# Patient Record
Sex: Female | Born: 1965 | Race: Black or African American | Hispanic: No | Marital: Married | Smoking: Never smoker
Health system: Southern US, Community
[De-identification: ages and names within clinical notes are randomized; demographics above are authoritative.]

## PROBLEM LIST (undated history)

## (undated) DIAGNOSIS — K219 Gastro-esophageal reflux disease without esophagitis: Secondary | ICD-10-CM

## (undated) DIAGNOSIS — N390 Urinary tract infection, site not specified: Secondary | ICD-10-CM

## (undated) DIAGNOSIS — T7840XA Allergy, unspecified, initial encounter: Secondary | ICD-10-CM

## (undated) DIAGNOSIS — D126 Benign neoplasm of colon, unspecified: Secondary | ICD-10-CM

## (undated) DIAGNOSIS — D259 Leiomyoma of uterus, unspecified: Secondary | ICD-10-CM

## (undated) HISTORY — DX: Gastro-esophageal reflux disease without esophagitis: K21.9

## (undated) HISTORY — DX: Leiomyoma of uterus, unspecified: D25.9

## (undated) HISTORY — DX: Allergy, unspecified, initial encounter: T78.40XA

## (undated) HISTORY — DX: Urinary tract infection, site not specified: N39.0

## (undated) HISTORY — DX: Benign neoplasm of colon, unspecified: D12.6

---

## 1998-12-29 ENCOUNTER — Other Ambulatory Visit: Admission: RE | Admit: 1998-12-29 | Discharge: 1998-12-29 | Payer: Self-pay | Admitting: Obstetrics & Gynecology

## 1999-10-14 ENCOUNTER — Other Ambulatory Visit: Admission: RE | Admit: 1999-10-14 | Discharge: 1999-10-14 | Payer: Self-pay | Admitting: Obstetrics & Gynecology

## 2000-10-14 ENCOUNTER — Other Ambulatory Visit: Admission: RE | Admit: 2000-10-14 | Discharge: 2000-10-14 | Payer: Self-pay | Admitting: Obstetrics & Gynecology

## 2001-10-18 ENCOUNTER — Other Ambulatory Visit: Admission: RE | Admit: 2001-10-18 | Discharge: 2001-10-18 | Payer: Self-pay | Admitting: Obstetrics & Gynecology

## 2002-05-22 ENCOUNTER — Encounter: Admission: RE | Admit: 2002-05-22 | Discharge: 2002-05-22 | Payer: Self-pay

## 2002-05-22 LAB — HM MAMMOGRAPHY: HM Mammogram: NEGATIVE

## 2002-10-22 ENCOUNTER — Other Ambulatory Visit: Admission: RE | Admit: 2002-10-22 | Discharge: 2002-10-22 | Payer: Self-pay | Admitting: Obstetrics & Gynecology

## 2003-10-25 ENCOUNTER — Other Ambulatory Visit: Admission: RE | Admit: 2003-10-25 | Discharge: 2003-10-25 | Payer: Self-pay | Admitting: Obstetrics & Gynecology

## 2005-03-14 ENCOUNTER — Inpatient Hospital Stay (HOSPITAL_COMMUNITY): Admission: AD | Admit: 2005-03-14 | Discharge: 2005-03-14 | Payer: Self-pay | Admitting: Obstetrics & Gynecology

## 2005-03-14 ENCOUNTER — Inpatient Hospital Stay (HOSPITAL_COMMUNITY): Admission: AD | Admit: 2005-03-14 | Discharge: 2005-03-16 | Payer: Self-pay | Admitting: Obstetrics and Gynecology

## 2005-04-20 ENCOUNTER — Other Ambulatory Visit: Admission: RE | Admit: 2005-04-20 | Discharge: 2005-04-20 | Payer: Self-pay | Admitting: Obstetrics and Gynecology

## 2008-10-15 ENCOUNTER — Ambulatory Visit: Payer: Self-pay | Admitting: Family Medicine

## 2008-11-27 ENCOUNTER — Ambulatory Visit: Payer: Self-pay | Admitting: Family Medicine

## 2009-01-07 DIAGNOSIS — D126 Benign neoplasm of colon, unspecified: Secondary | ICD-10-CM

## 2009-01-07 HISTORY — DX: Benign neoplasm of colon, unspecified: D12.6

## 2009-01-10 HISTORY — PX: COLONOSCOPY: SHX174

## 2009-01-10 LAB — HM COLONOSCOPY

## 2010-10-23 ENCOUNTER — Ambulatory Visit (INDEPENDENT_AMBULATORY_CARE_PROVIDER_SITE_OTHER): Payer: BC Managed Care – PPO | Admitting: Family Medicine

## 2010-10-23 DIAGNOSIS — J309 Allergic rhinitis, unspecified: Secondary | ICD-10-CM

## 2010-12-25 NOTE — Discharge Summary (Signed)
NAMEHILARI, Amy Hill NO.:  [id number]   MEDICAL RECORD NO.:  [id number]          PATIENT TYPE:  INP   LOCATION:  9125                          FACILITY:  [facility]   PHYSICIAN:  Malva Limes, M.D.    DATE OF BIRTH:  09-25-1965   DATE OF ADMISSION:  03/14/2005  DATE OF DISCHARGE:  03/16/2005                                 DISCHARGE SUMMARY   FINAL DIAGNOSES:  Intrauterine pregnancy at [redacted] weeks gestation, active labor  with breech presentation.   PROCEDURE:  Primary low flap transverse cesarean section.   SURGEON:  Dr. Miguel Aschoff.   COMPLICATIONS:  None.   This 45 year old G1 P0 presents at 38-2/[redacted] weeks gestation to the [facility] with spontaneous rupture of membranes in active labor. Upon  admission, the patient was found to have a breech presentation and she had  been scheduled for a cesarean section on March 19, 2005 because of this  presentation. She is now being taken to the operating room.  The patient's  antepartum course up to this point has been complicated by advanced maternal  age. She did have an amniocentesis with a 46 X keloid type.  The patient's  antepartum course otherwise has been uncomplicated. She is taken to the  operating room by Dr. Miguel Aschoff on March 14, 2005 where a primary low flap  transverse cesarean section is performed with the delivery of a 6 pounds 5  ounces female infant with Apgars of 9 and 9.  The delivery went without  complications. The patient's postoperative course was benign without  significant fevers. She was felt ready to go home by postoperative day #2.  She was given a rubella vaccine for a nonimmune status prior to discharge.  She was sent home on a regular diet, told to decrease activities, told to  continue her prenatal vitamins and her iron supplement, was given Percocet  one to two every four hours as needed for pain, told she could use ibuprofen  up to 600 mg every six hours as needed for pain, was to  follow up in the  office in four weeks.   LABORATORIES ON DISCHARGE:  The patient had a hemoglobin of 9.2, white blood  cell count of 16.0.      Leilani Able, P.A.-C.    ______________________________  Malva Limes, M.D.    MB/MEDQ  D:  04/15/2005  T:  04/15/2005  Job:  [id number]

## 2010-12-25 NOTE — Op Note (Signed)
Amy Hill, Amy Hill NO.:  [id number]   MEDICAL RECORD NO.:  [id number]          PATIENT TYPE:  INP   LOCATION:  9125                          FACILITY:  [facility]   PHYSICIAN:  Miguel Aschoff, M.D.       DATE OF BIRTH:  12-Sep-1965   DATE OF PROCEDURE:  03/14/2005  DATE OF DISCHARGE:                                 OPERATIVE REPORT   PREOPERATIVE DIAGNOSIS:  Intrauterine pregnancy at 38 weeks in active labor,  with breech presentation,/   POSTOPERATIVE DIAGNOSIS:  Intrauterine pregnancy at 38 weeks in active  labor, with breech presentation, with  delivery of viable female infant  Apgar 9/9, weighing 6 pounds 5 ounces.   PROCEDURE:  Primary low flap transverse cesarean.   SECTION:  Surgeon, Miguel Aschoff, M.D.   ANESTHESIA:  Spinal.   COMPLICATIONS:  None.   JUSTIFICATION:  The patient is a 45 year old black female, gravida 1, para  0, at 38-2/7 weeks, who presented to the triage at [facility] with  spontaneous rupture of membranes, active labor, and on examination found to  have a breech presentation.  The patient had been scheduled for primary  cesarean section on August 11 and is now being taken the operating room this  procedures since she is now in active labor.  The risks and benefits of the  procedure were discussed with the patient.   PROCEDURE:  The patient was taken to the operating room and placed in a  sitting position, and spinal anesthesia was administered without difficulty.  She was then placed in the supine position deviated to the left and prepped  and draped in the usual sterile fashion.  A Pfannenstiel incision was made,  extended through the subcutaneous tissue with bleeding points being clamped  and coagulated as they were encountered.  The fascia was then identified,  incised transversely.  It was then separated from the underlying rectus  muscles.  Rectus muscles were divided in the midline.  The peritoneum was  then found and entered,  carefully avoiding underlying structures.  At this  point a bladder flap was created and protected with a bladder blade.  Then  an elliptical transverse incision was made into the lower uterine segment.  The amniotic cavity was entered.  Light meconium-stained fluid was noted.  The patient was then delivered of a viable female infant, Apgar 9 at one  minute and 9 at five minutes for a left sacrum anterior position.  The baby was in frank breech position.  The nose and mouth were suctioned  with the DeLee suction and the baby was handed to the pediatric team in  attendance.  The baby weighed 6 pounds 5 ounces.  Cord bloods were obtained for appropriate studies.  The placenta was then  delivered and at this point the uterus was evacuated and any remaining  products of conception.  The angles of the uterine incision was then ligated  using figure-of-eight sutures of #1 Vicryl.  Then the uterus was closed in  layers.  The first layer was a running interlocking suture of #1 Vicryl  followed  by an imbricating suture of #1 Vicryl.  The bladder flap was reapproximated using running continuous 2-0 Vicryl  suture.  At this point lap counts and instrument counts were taken and found  to be correct and the abdomen was closed.  The parietal peritoneum was  closed using running continuous 0 Vicryl suture.  Rectus muscles were  reapproximated using running continuous 0 Vicryl suture.  The fascia was  closed using two sutures of 0 Vicryl, each starting at the lateral fascial  angles and meeting in the midline.  The subcutaneous tissue was closed using  interrupted 2-0 Vicryl suture, and the skin  incision was closed using staples.  The estimated blood loss was  approximately 600 mL.  The patient tolerated the procedure well and went to  the recovery room in satisfactory condition.  The baby was taken to the  nursery in satisfactory condition.      Miguel Aschoff, M.D.  Electronically Signed      AR/MEDQ  D:  03/14/2005  T:  03/15/2005  Job:  [id number]

## 2011-01-14 ENCOUNTER — Encounter: Payer: Self-pay | Admitting: *Deleted

## 2011-01-21 ENCOUNTER — Ambulatory Visit (INDEPENDENT_AMBULATORY_CARE_PROVIDER_SITE_OTHER): Payer: BC Managed Care – PPO | Admitting: Family Medicine

## 2011-01-21 ENCOUNTER — Encounter: Payer: Self-pay | Admitting: Family Medicine

## 2011-01-21 ENCOUNTER — Other Ambulatory Visit: Payer: Self-pay | Admitting: Family Medicine

## 2011-01-21 VITALS — BP 120/74 | HR 80 | Ht 59.0 in | Wt 151.0 lb

## 2011-01-21 DIAGNOSIS — Z23 Encounter for immunization: Secondary | ICD-10-CM

## 2011-01-21 DIAGNOSIS — R5383 Other fatigue: Secondary | ICD-10-CM

## 2011-01-21 DIAGNOSIS — Z0000 Encounter for general adult medical examination without abnormal findings: Secondary | ICD-10-CM

## 2011-01-21 DIAGNOSIS — R5381 Other malaise: Secondary | ICD-10-CM

## 2011-01-21 DIAGNOSIS — E7800 Pure hypercholesterolemia, unspecified: Secondary | ICD-10-CM

## 2011-01-21 LAB — COMPREHENSIVE METABOLIC PANEL
ALT: 13 U/L (ref 0–35)
AST: 18 U/L (ref 0–37)
Albumin: 3.6 g/dL (ref 3.5–5.2)
Alkaline Phosphatase: 55 U/L (ref 39–117)
BUN: 10 mg/dL (ref 6–23)
CO2: 26 mEq/L (ref 19–32)
Calcium: 9 mg/dL (ref 8.4–10.5)
Chloride: 101 mEq/L (ref 96–112)
Creat: 0.69 mg/dL (ref 0.50–1.10)
Glucose, Bld: 78 mg/dL (ref 70–99)
Potassium: 4.2 mEq/L (ref 3.5–5.3)
Sodium: 134 mEq/L — ABNORMAL LOW (ref 135–145)
Total Bilirubin: 0.5 mg/dL (ref 0.3–1.2)
Total Protein: 8.5 g/dL — ABNORMAL HIGH (ref 6.0–8.3)

## 2011-01-21 LAB — POCT URINALYSIS DIPSTICK
Bilirubin, UA: NEGATIVE
Blood, UA: NEGATIVE
Glucose, UA: NEGATIVE
Ketones, UA: NEGATIVE
Leukocytes, UA: NEGATIVE
Nitrite, UA: NEGATIVE
Protein, UA: NEGATIVE
Spec Grav, UA: 1.015
Urobilinogen, UA: NEGATIVE
pH, UA: 5

## 2011-01-21 LAB — LIPID PANEL
Cholesterol: 206 mg/dL — ABNORMAL HIGH (ref 0–200)
HDL: 82 mg/dL (ref >39)
LDL Cholesterol: 108 mg/dL — ABNORMAL HIGH (ref 0–99)
Total CHOL/HDL Ratio: 2.5 Ratio
Triglycerides: 78 mg/dL (ref <150)
VLDL: 16 mg/dL (ref 0–40)

## 2011-01-21 LAB — CBC WITH DIFFERENTIAL/PLATELET
Eosinophils Relative: 1 % (ref 0–5)
HCT: 33.3 % — ABNORMAL LOW (ref 36.0–46.0)
Hemoglobin: 10.7 g/dL — ABNORMAL LOW (ref 12.0–15.0)
Lymphocytes Relative: 34 % (ref 12–46)
Lymphs Abs: 2.2 10*3/uL (ref 0.7–4.0)
MCV: 82.2 fL (ref 78.0–100.0)
Monocytes Absolute: 0.6 10*3/uL (ref 0.1–1.0)
RBC: 4.05 MIL/uL (ref 3.87–5.11)
WBC: 6.4 10*3/uL (ref 4.0–10.5)

## 2011-01-21 NOTE — Progress Notes (Signed)
Subjective:    Patient ID: Amy Hill, female    DOB: 06/13/1966, 45 y.o.   MRN: [id number]  HPI Amy Hill is a 45 y.o. female who presents for a complete physical.  She has the following concerns: None.  She has a strong family history of blood pressure and cholesterol problems, so wanted a physical to have these things checked  Immunization History  Administered Date(s) Administered  . Td 04/09/2002   Last Pap smear: 07/2010 Last mammogram: 08/2010 Last colonoscopy: 01/2009 Last DEXA: n/a Ophtho: over 5 years ago Dentist: over 5 years ago Exercise: irregularly  Past Medical History  Diagnosis Date  . Contraceptive management   . Allergy   . Migraine   . Frequent UTI   . Adenomatous colon polyp 01/2009    Past Surgical History  Procedure Date  . Cesarean section   . Colonoscopy 01/10/2009    tubular adenoma (Dr. Loreta Ave); repeat every 5 years    History   Social History  . Marital Status: Married    Spouse Name: N/A    Number of Children: 1  . Years of Education: N/A   Occupational History  . unemployed (previously worked doing Clinical biochemist)    Social History Main Topics  . Smoking status: Never Smoker   . Smokeless tobacco: Never Used  . Alcohol Use: Yes     1-2 drinks once a month  . Drug Use: No  . Sexually Active: Yes    Birth Control/ Protection: Pill   Other Topics Concern  . Not on file   Social History Narrative   1 daughter and 3 stepsons (1 of the three lives with her)    Family History  Problem Relation Age of Onset  . Hyperlipidemia Mother   . Hypertension Mother   . Colon polyps Father   . Hyperlipidemia Father   . Hypertension Father   . Gout Father   . Heart disease Maternal Grandfather   . Cancer Neg Hx     Current outpatient prescriptions:Azelastine HCl (ASTEPRO NA), Place 2 sprays into the nose daily.  , Disp: , Rfl: ;  TRI-SPRINTEC 0.18/0.215/0.25 MG-35 MCG TABS, , Disp: , Rfl: ;  Triamcinolone Acetonide (NASACORT  AQ NA), Place 2 sprays into the nose daily.  , Disp: , Rfl: ;  DISCONTD: norethindrone-ethinyl estradiol (TRIPHASIL) 0.5/0.75/1-35 MG-MCG per tablet, Take 1 tablet by mouth daily.  , Disp: , Rfl:  DISCONTD: cetirizine (ZYRTEC) 10 MG tablet, Take 10 mg by mouth daily.  , Disp: , Rfl:   No Known Allergies  Review of Systems The patient denies anorexia, fever, weight changes, headaches,  vision changes, decreased hearing, ear pain, sore throat, breast concerns, chest pain, palpitations, dizziness, syncope, dyspnea on exertion, cough, swelling, nausea, vomiting, diarrhea, constipation, abdominal pain, melena, hematochezia, indigestion/heartburn, hematuria, incontinence, dysuria, irregular menstrual cycles, vaginal discharge, odor or itch, genital lesions, joint pains, numbness, tingling, weakness, tremor, suspicious skin lesions, depression, anxiety, abnormal bleeding/bruising, or enlarged lymph nodes.    Objective:   Physical Exam BP 120/74  Pulse 80  Ht 4\' 11"  (1.499 m)  Wt 151 lb (68.493 kg)  BMI 30.50 kg/m2  LMP 01/14/2011  General Appearance:    Alert, cooperative, no distress, appears stated age  Head:    Normocephalic, without obvious abnormality, atraumatic  Eyes:    PERRL, conjunctiva/corneas clear, EOM's intact, fundi    benign  Ears:    Normal TM's and external ear canals  Nose:   Nares normal, mucosa normal,  no drainage or sinus   tenderness  Throat:   Lips, mucosa, and tongue normal; teeth and gums normal  Neck:   Supple, no lymphadenopathy;  thyroid:  no   enlargement/tenderness/nodules; no carotid   bruit or JVD  Back:    Spine nontender, no curvature, ROM normal, no CVA     tenderness  Lungs:     Clear to auscultation bilaterally without wheezes, rales or     ronchi; respirations unlabored  Chest Wall:    No tenderness or deformity   Heart:    Regular rate and rhythm, S1 and S2 normal, no murmur, rub   or gallop  Breast Exam:    Deferred to GYN  Abdomen:     Soft,  non-tender, nondistended, normoactive bowel sounds,    no masses, no hepatosplenomegaly  Genitalia:    Deferred to GYN     Extremities:   No clubbing, cyanosis or edema  Pulses:   2+ and symmetric all extremities  Skin:   Skin color, texture, turgor normal, no rashes or lesions  Lymph nodes:   Cervical, supraclavicular, and axillary nodes normal  Neurologic:   CNII-XII intact, normal strength, sensation and gait; reflexes 2+ and symmetric throughout          Psych:   Normal mood, affect, hygiene and grooming.        Assessment & Plan:   1. Routine general medical examination at a health care facility  Visual acuity screening, POCT urinalysis dipstick, Comprehensive metabolic panel, CBC with Differential, TSH, Vitamin D 25 hydroxy  2. Pure hypercholesterolemia  Lipid panel   elevated total cholesterol, but excellent HDL  3. Fatigue  Comprehensive metabolic panel, CBC with Differential, TSH, Vitamin D 25 hydroxy  4. Need for Tdap vaccination  Tdap vaccine greater than or equal to 7yo IM   Discussed monthly self breast exams and yearly mammograms after the age of 84; at least 30 minutes of aerobic activity at least 5 days/week; proper sunscreen use reviewed; healthy diet, including goals of calcium and vitamin D intake and alcohol recommendations (less than or equal to 1 drink/day) reviewed; regular seatbelt use; changing batteries in smoke detectors.  Immunization recommendations discussed--TdaP today.  Colonoscopy recommendations reviewed--due again 2015.  Weight loss was encouraged.  Recommended cutting out regular soda and sweet tea, drinking more water.

## 2011-01-22 LAB — IRON: Iron: 81 ug/dL (ref 42–145)

## 2011-01-22 LAB — FERRITIN: Ferritin: 8 ng/mL — ABNORMAL LOW (ref 10–291)

## 2011-01-22 LAB — VITAMIN B12: Vitamin B-12: 357 pg/mL (ref 211–911)

## 2011-01-22 NOTE — Progress Notes (Signed)
Alvino Chapel has seen message and is checking in on this

## 2011-01-25 ENCOUNTER — Telehealth: Payer: Self-pay | Admitting: *Deleted

## 2011-01-25 NOTE — Telephone Encounter (Signed)
Left message on pt's vm asking her to return my call to go over lab results.

## 2011-01-25 NOTE — Telephone Encounter (Signed)
Patient returned my call. She will start OTC iron and recheck in 4-6weeks. Lab visit scheduled for 03/09/2011@8 :30am.vs

## 2011-03-08 ENCOUNTER — Other Ambulatory Visit: Payer: BC Managed Care – PPO

## 2011-03-08 DIAGNOSIS — E871 Hypo-osmolality and hyponatremia: Secondary | ICD-10-CM

## 2011-03-08 DIAGNOSIS — D649 Anemia, unspecified: Secondary | ICD-10-CM

## 2011-03-08 LAB — CBC WITH DIFFERENTIAL/PLATELET
Basophils Absolute: 0 10*3/uL (ref 0.0–0.1)
Basophils Relative: 0 % (ref 0–1)
Eosinophils Relative: 1 % (ref 0–5)
Lymphocytes Relative: 39 % (ref 12–46)
MCHC: 31.4 g/dL (ref 30.0–36.0)
MCV: 85.7 fL (ref 78.0–100.0)
Platelets: 211 10*3/uL (ref 150–400)
RDW: 16.4 % — ABNORMAL HIGH (ref 11.5–15.5)
WBC: 5.2 10*3/uL (ref 4.0–10.5)

## 2011-03-08 LAB — BASIC METABOLIC PANEL
CO2: 25 mEq/L (ref 19–32)
Chloride: 104 mEq/L (ref 96–112)
Glucose, Bld: 82 mg/dL (ref 70–99)
Potassium: 3.8 mEq/L (ref 3.5–5.3)
Sodium: 137 mEq/L (ref 135–145)

## 2011-03-09 ENCOUNTER — Other Ambulatory Visit: Payer: BC Managed Care – PPO

## 2011-03-10 ENCOUNTER — Telehealth: Payer: Self-pay | Admitting: *Deleted

## 2011-03-10 NOTE — Telephone Encounter (Signed)
Left message for patient to call me back to go over lab results

## 2011-03-11 ENCOUNTER — Telehealth: Payer: Self-pay | Admitting: *Deleted

## 2011-03-11 ENCOUNTER — Other Ambulatory Visit: Payer: Self-pay | Admitting: *Deleted

## 2011-03-11 DIAGNOSIS — D509 Iron deficiency anemia, unspecified: Secondary | ICD-10-CM

## 2011-03-11 NOTE — Telephone Encounter (Signed)
Spoke with patient, labs given. Patient scheduled for 09/13/11 @8 :30am for cbc and ferritin recheck(280.9).

## 2011-04-22 ENCOUNTER — Ambulatory Visit (INDEPENDENT_AMBULATORY_CARE_PROVIDER_SITE_OTHER): Payer: Managed Care, Other (non HMO) | Admitting: Family Medicine

## 2011-04-22 ENCOUNTER — Encounter: Payer: Self-pay | Admitting: Family Medicine

## 2011-04-22 VITALS — BP 118/68 | HR 84 | Ht 61.0 in | Wt 150.0 lb

## 2011-04-22 DIAGNOSIS — T148XXA Other injury of unspecified body region, initial encounter: Secondary | ICD-10-CM

## 2011-04-22 MED ORDER — NAPROXEN 500 MG PO TABS: 500.0000 mg | ORAL_TABLET | Freq: Two times a day (BID) | ORAL | Status: AC
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
## 2011-04-22 NOTE — Patient Instructions (Signed)
Heat, stretches, massage. Take anti-inflammatories regularly until pain is improved.  You may use Tylenol along with the Naprosyn, if needed for pain, but do not take any other OTC anti-inflammatories while taking the prescription  Muscle Strain / Pulled Muscle A pulled muscle, or muscle strain, occurs when a muscle is over-stretched. A small number of muscle fibers may also be torn. This is especially common in athletes. This happens when a sudden violent force placed on a muscle pushes it past its capacity. Usually, recovery from a pulled muscle takes 1 to 2 weeks. But complete healing will take 5 to 6 weeks. There are millions of muscle fibers. Following injury, your body will usually return to normal quickly. HOME CARE INSTRUCTIONS  While awake, apply ice to the sore muscle for 15 minutes each hour for the first 2 days. Put ice in a plastic bag and place a towel between the bag of ice and your skin.   Do not use the pulled muscle for several days. Do not use the muscle if you have pain.   You may wrap the injured area with an elastic bandage for comfort. Be careful not to bind it too tightly. This may interfere with blood circulation.   Only take over-the-counter or prescription medicines for pain, discomfort, or fever as directed by your caregiver. Do not use aspirin as this will increase bleeding (bruising) at injury site.   Warming up before exercise helps prevent muscle strains.  SEEK MEDICAL CARE IF: There is increased pain or swelling in the affected area. MAKE SURE YOU:   Understand these instructions.   Will watch your condition.   Will get help right away if you are not doing well or get worse.  Document Released: 07/26/2005 Document Re-Released: 10/20/2009 [facility] Patient Information 2011 [place], [place].

## 2011-09-13 ENCOUNTER — Other Ambulatory Visit: Payer: BC Managed Care – PPO

## 2011-09-13 DIAGNOSIS — D509 Iron deficiency anemia, unspecified: Secondary | ICD-10-CM

## 2011-09-13 LAB — CBC WITH DIFFERENTIAL/PLATELET
Basophils Relative: 0 % (ref 0–1)
HCT: 37.9 % (ref 36.0–46.0)
Hemoglobin: 12.8 g/dL (ref 12.0–15.0)
MCHC: 33.8 g/dL (ref 30.0–36.0)
Monocytes Absolute: 0.5 10*3/uL (ref 0.1–1.0)
Monocytes Relative: 11 % (ref 3–12)
Neutro Abs: 2.4 10*3/uL (ref 1.7–7.7)

## 2011-09-14 ENCOUNTER — Encounter: Payer: Self-pay | Admitting: Family Medicine

## 2011-12-27 ENCOUNTER — Encounter: Payer: Self-pay | Admitting: Family Medicine

## 2011-12-27 ENCOUNTER — Ambulatory Visit (INDEPENDENT_AMBULATORY_CARE_PROVIDER_SITE_OTHER): Payer: BC Managed Care – PPO | Admitting: Family Medicine

## 2011-12-27 ENCOUNTER — Ambulatory Visit: Payer: BC Managed Care – PPO | Admitting: Family Medicine

## 2011-12-27 VITALS — BP 128/88 | HR 86 | Ht 58.5 in | Wt 146.0 lb

## 2011-12-27 DIAGNOSIS — R0789 Other chest pain: Secondary | ICD-10-CM

## 2011-12-27 NOTE — Patient Instructions (Signed)
Please try and exercise daily. Try and leave your desk for a lunch break. Consider some stress reduction techniques, relaxation exercises, and regular exercise to help control stress  If worsening symptoms of chest tightness, please return for further evaluation. (x-ray, labs, possibly breathing treatment, etc)

## 2011-12-27 NOTE — Progress Notes (Signed)
Chief Complaint  Patient presents with  . tightness in chest    off and on tightness in chest. no other symptoms   HPI: First noticed tightness in her chest about 5 days ago while at work.  Wasn't exerting herself, was at rest, but was stressed.  New job since January, and has been trying to play "catch up", stressful.  Chest tightness comes and goes.  Doesn't notice it as much when she gets home.  Only slight over the weekend, less than while at work last week.  Denies feeling short of breath, palpitations. Not related to meals. Not related to exercise. Walks twice a week without problems.  +allergies--controlled with current regimen of nasal sprays.  Slight cough first thing in the morning, with throat clearing from postnasal drainage.  Denies heartburn or reflux.    She thinks it is due to stress.    Past Medical History  Diagnosis Date  . Contraceptive management   . Allergy   . Migraine   . Frequent UTI   . Adenomatous colon polyp 01/2009    Past Surgical History  Procedure Date  . Cesarean section   . Colonoscopy 01/10/2009    tubular adenoma (Dr. Loreta Ave); repeat every 5 years    History   Social History  . Marital Status: Married    Spouse Name: N/A    Number of Children: 1  . Years of Education: N/A   Occupational History  . unemployed (previously worked doing Clinical biochemist)    Social History Main Topics  . Smoking status: Never Smoker   . Smokeless tobacco: Never Used  . Alcohol Use: Yes     1-2 drinks once a month  . Drug Use: No  . Sexually Active: Yes    Birth Control/ Protection: Pill   Other Topics Concern  . Not on file   Social History Narrative   1 daughter and 3 stepsons (1 of the three lives with her)    Family History  Problem Relation Age of Onset  . Hyperlipidemia Mother   . Hypertension Mother   . Colon polyps Father   . Hyperlipidemia Father   . Hypertension Father   . Gout Father   . Heart disease Maternal Grandfather   .  Cancer Neg Hx     Current outpatient prescriptions:Azelastine HCl (ASTEPRO NA), Place 2 sprays into the nose daily.  , Disp: , Rfl: ;  Multiple Vitamins-Minerals (MULTI-VITAMIN GUMMIES PO), Take 1 each by mouth daily.  , Disp: , Rfl: ;  TRI-SPRINTEC 0.18/0.215/0.25 MG-35 MCG TABS, , Disp: , Rfl: ;  Triamcinolone Acetonide (NASACORT AQ NA), Place 2 sprays into the nose daily.  , Disp: , Rfl:  cyclobenzaprine (FLEXERIL) 10 MG tablet, Take 10 mg by mouth daily.  , Disp: , Rfl: ;  naproxen (NAPROSYN) 500 MG tablet, Take 1 tablet (500 mg total) by mouth 2 (two) times daily with a meal. Take as needed for pain, Disp: 30 tablet, Rfl: 0  No Known Allergies  ROS: Denies fevers, chest pain (just slight tightness), shortness of breath.  Denies nausea, vomiting, heartburn, belching, bowels are normal. +allergies.  See HPI.  Denies urinary complaints, bleeding/bruising, depression.  PHYSICAL EXAM: BP 128/88  Pulse 86  Ht 4' 10.5" (1.486 m)  Wt 146 lb (66.225 kg)  BMI 29.99 kg/m2 Well developed, pleasant female in no distress HEENT:  PERRL, EOMI, conjunctiva clear.  TM's and EAc's normal.  Nasal mucosa mildly edematous, no erythema.  Sinuses nontender.  OP clear  Neck: no lymphadenopathy or mass Heart: regular rate and rhythm without murmur Lungs: clear bilaterally. No wheeze or cough with forced expiration Chest: nontender to palpation Abdomen: nontender, no mass Extremities: no edema Psych: slightly anxious, otherwise normal  EKG: NSR rate 90.  Possible LAE, borderline LVH.  No acute abnormalities  ASSESSMENT/PLAN: 1. Chest tightness    Differential diagnoses reviewed.  Discussed pulmonary etiologies--reassured by normal exam.  Can't exclude mild RAD--no wheezing on exam.  Declined trial of albuterol to see if symptoms improved.  Consider if story sounds more like RAD, NOT consistent now. Doubt cardiac etiology--symptoms occuring at rest, not worse with exertion, no EKG changes worrisome for  ischemia.  Chest wall is nontender, so doesn't appear to be musculoskeletal.  No GI complaints and not related to meals to be consistent with reflux.  Likely is related to stress.  Discussed relaxation techniques, regular exercise  F/u if persistent or worsening symptoms.  Chart reviewed--lipids normal <1 year ago.  Has been treated for iron deficiency anemia--last check 09/2011 and much improved; advised to take iron with menstrual cycles.  25 minute visit, more than 1/2 counseling

## 2011-12-30 ENCOUNTER — Encounter: Payer: Self-pay | Admitting: Family Medicine

## 2012-05-30 ENCOUNTER — Telehealth: Payer: Self-pay | Admitting: Internal Medicine

## 2012-05-30 NOTE — Telephone Encounter (Signed)
Faxed medical records to [facility] attorney and counselors at [employer]

## 2012-10-02 ENCOUNTER — Other Ambulatory Visit: Payer: Self-pay

## 2013-06-21 ENCOUNTER — Encounter: Payer: Self-pay | Admitting: Family Medicine

## 2013-08-10 ENCOUNTER — Encounter: Payer: Self-pay | Admitting: Medical

## 2013-08-10 ENCOUNTER — Ambulatory Visit (INDEPENDENT_AMBULATORY_CARE_PROVIDER_SITE_OTHER): Payer: BC Managed Care – PPO | Admitting: Medical

## 2013-08-10 VITALS — BP 150/80 | HR 104 | Temp 98.4°F | Resp 16 | Wt 149.0 lb

## 2013-08-10 DIAGNOSIS — R030 Elevated blood-pressure reading, without diagnosis of hypertension: Secondary | ICD-10-CM

## 2013-08-10 DIAGNOSIS — R42 Dizziness and giddiness: Secondary | ICD-10-CM

## 2013-08-10 DIAGNOSIS — R5381 Other malaise: Secondary | ICD-10-CM

## 2013-08-10 DIAGNOSIS — R5383 Other fatigue: Secondary | ICD-10-CM

## 2013-08-10 DIAGNOSIS — R531 Weakness: Secondary | ICD-10-CM

## 2013-08-10 LAB — BASIC METABOLIC PANEL
BUN: 8 mg/dL (ref 6–23)
CHLORIDE: 103 meq/L (ref 96–112)
CO2: 25 meq/L (ref 19–32)
CREATININE: 0.79 mg/dL (ref 0.50–1.10)
Calcium: 8.8 mg/dL (ref 8.4–10.5)
Glucose, Bld: 91 mg/dL (ref 70–99)
Potassium: 3.6 mEq/L (ref 3.5–5.3)
Sodium: 138 mEq/L (ref 135–145)

## 2013-08-10 LAB — POCT URINALYSIS DIPSTICK
Bilirubin, UA: NEGATIVE
Glucose, UA: NEGATIVE
Ketones, UA: NEGATIVE
Leukocytes, UA: NEGATIVE
NITRITE UA: NEGATIVE
PH UA: 6
Spec Grav, UA: <=1.005
UROBILINOGEN UA: NEGATIVE

## 2013-08-10 LAB — POCT HEMOGLOBIN: Hemoglobin: 13.2 g/dL (ref 12.2–16.2)

## 2013-08-10 LAB — TSH: TSH: 1.445 u[IU]/mL (ref 0.350–4.500)

## 2013-08-10 LAB — POCT URINE PREGNANCY: Preg Test, Ur: NEGATIVE

## 2013-08-10 NOTE — Progress Notes (Signed)
Subjective:  Amy Hill is a 48 y.o. female who presents for generalized weakness.  For the past 1.5 week, she notes generalized weakness, light headedness. Yesterday was fine.  Got light headed Monday and Wednesday.  This morning felt a little lightheaded too.   Feels this mostly in the morning when she gets up.   Some symptoms during the day, but mostly morning.  Doesn't usually eat breakfast.   She notes in the past, last visit here Dr. Tomi Bamberger advised she use iron since last visit but she has not taken iron.  LMP currently.  Last sexual activity 1 wk ago.  Married.  Takes OCP, very compliant with this.  Saw urgent care /[facility] Wednesday.  BP was up a little, but they really didn't give diagnosis or treatment one way or another.  No other aggravating or relieving factors.    Denies hx/o heart disease in self or family.  No recent heavy alcohol use.  Drank a few drinks on Christmas day.   She does endorse hx/o anemia, but never serious, no hx/o transfusion.  Review of Systems Constitutional: -fever, -chills, -sweats, -unexpected weight change, +fatigue ENT: -runny nose, -ear pain, -sore throat Cardiology:  -chest pain, -palpitations, -edema  Respiratory: +mild occasdional cough, -shortness of breath, -wheezing Gastroenterology: -abdominal pain, -nausea, -vomiting, -diarrhea, -constipation  Hematology: -bleeding or bruising problems Musculoskeletal: -arthralgias, -myalgias, -joint swelling, -back pain Ophthalmology: -vision changes Urology: -dysuria, -difficulty urinating, -hematuria, -urinary frequency, -urgency Neurology: -headache, -tingling, -numbness, -speech changes, -no mental status changes    Past Medical History  Diagnosis Date  . Contraceptive management   . Allergy   . Migraine   . Frequent UTI   . Adenomatous colon polyp 01/2009    The following portions of the patient's history were reviewed and updated as  appropriate: allergies, current medications, past family history, past medical history, past social history, past surgical history and problem list.  Objective: Physical Exam  BP 150/90  Pulse 100  Temp(Src) 98.4 F (36.9 C) (Oral)  Resp 16  Wt 149 lb (67.586 kg)  LMP 08/09/2013   General appearance: alert, no distress, WD/WN, lean AA female HEENT: normocephalic, sclerae anicteric, conjunctiva pink and moist, TMs pearly, nares patent, no discharge or erythema, pharynx normal Oral cavity: MMM, no lesions Neck: supple, no lymphadenopathy, no thyromegaly, no masses Heart: tachycardic at about 100, otherwise RRR, normal S1, S2, no murmurs Lungs: CTA bilaterally, no wheezes, rhonchi, or rales Abdomen: +bs, soft, non tender, non distended, no masses, no hepatomegaly, no splenomegaly Pulses: 2+ radial pulses, 2+ pedal pulses, normal cap refill Ext: no edema Neuro: CN2-12 intact, DTRs 2+, nonfocal exam    Adult ECG Report  Indication: generalized weakness  Rate: 98bpm  Rhythm: normal sinus rhythm  QRS Axis: 69 degrees  PR Interval: 168ms  QRS Duration: 19ms  QTc: 476ms  Conduction Disturbances: none  Other Abnormalities: voltage criteria for LVH  Patient's cardiac risk factors are: none.  EKG comparison: 12/27/11 EKG, unchanged.  At that time was also J point elevation, LVH criteria  Narrative Interpretation: LVH per voltage criteria, no acute change, abnormal EKG    Assessment: Encounter Diagnoses  Name Primary?  . Generalized weakness Yes  . Lightheaded   . Elevated blood pressure reading without diagnosis of hypertension     Plan: Her orthostatic readings were fine, no drop in blood pressure, but there was a slight pulse increase from lying to standing though.  Her hemoglobin was normal on fingerstick, urine pregnancy negative, urinalysis  positive for blood but she is on her menstrual cycle now.  We will send STAT BMET and TSH.    We discussed her exam findings, which  are most notable for pale mucous membranes, a little tachycardic, elevated blood pressure, otherwise her exam is pretty normal.  We discussed her generalized symptoms of weakness which could be related to several things including viral syndrome, mild dehydration, other, etc.  I advised that we cannot rule out certain things based on what information we have now.  Looking back of her prior records she has had normal blood pressure readings prior.  After she left we were able to pull her old chart and EKG findings appear to be the same as prior, she has even seen cardiology in the past, was put on medication for a period of time although she didn't recall even been on medication for blood pressure in the past per the conversation today.  Follow up: pending labs

## 2013-08-13 ENCOUNTER — Telehealth: Payer: Self-pay | Admitting: Family Medicine

## 2013-08-13 ENCOUNTER — Ambulatory Visit: Payer: BC Managed Care – PPO

## 2013-08-13 NOTE — Telephone Encounter (Signed)
I assume she worked on improving her hydration and rest over the weekend.   Is she taking any ibuprofen or tylenol for pain?  If not, try this.  Regarding the breathing symptoms, if this continues or worsens in the next few days, may consider recheck

## 2013-08-13 NOTE — Telephone Encounter (Signed)
Patient came by here after lunch for a BP check and it was 150/78. CLS

## 2013-08-13 NOTE — Telephone Encounter (Signed)
Patient called with a up date of her Sx. She said she was at work . She said she still feels a little week  But not like Friday. She states when she is a certain way when she breaths she has pain on her right side. CLS

## 2013-08-14 NOTE — Telephone Encounter (Signed)
I left the patient a detailed message. CLS

## 2013-08-14 NOTE — Telephone Encounter (Signed)
Have her record a few more random BP readings at local drug store, and f/u with Dr. Tomi Bamberger in a week or 2.  It would appear that we may need to start her on BP medication after reviewing recent readings and prior chart records.  Dr. Tomi Bamberger is her PCM.

## 2013-08-23 ENCOUNTER — Encounter: Payer: Self-pay | Admitting: Family Medicine

## 2013-08-23 ENCOUNTER — Ambulatory Visit: Payer: BC Managed Care – PPO | Admitting: Family Medicine

## 2013-08-23 ENCOUNTER — Ambulatory Visit (INDEPENDENT_AMBULATORY_CARE_PROVIDER_SITE_OTHER): Payer: BC Managed Care – PPO | Admitting: Family Medicine

## 2013-08-23 VITALS — BP 118/74 | HR 104 | Ht 61.0 in | Wt 144.0 lb

## 2013-08-23 DIAGNOSIS — R079 Chest pain, unspecified: Secondary | ICD-10-CM

## 2013-08-23 DIAGNOSIS — R059 Cough, unspecified: Secondary | ICD-10-CM

## 2013-08-23 DIAGNOSIS — R05 Cough: Secondary | ICD-10-CM

## 2013-08-23 NOTE — Progress Notes (Signed)
Chief Complaint  Patient presents with  . Chest Pain    did have a cough after Christmas for 2 weeks or so that went away. Over the last week she has been having some intermittent dull chest pains that mover from place to place. Does not feel SOB. Saw [facility] 08/10/13 and EKG was done.    She presents today with complaint of chest pain. It comes and goes, sometimes right sided, sometimes left sided, sometimes the whole chest. It isn't sore to touch. Sometimes she feels the pain with a deep breath, but most of the time she doesn't have pain with breathing.  Pain isn't exertional, or related to eating.  Pain isn't positional.  She feels the pain more in the evenings (not lying down, just later in the evening).  Pain is described as dull ache, just lasts for a few seconds, comes and goes within the hour, and has pain throughout the day. Pain has been daily for the last 2-3 days.  She denies any shortness of breath.  Notes reviewed from 08/10/13 visit, where she had generalized weakness.  She had a cough at that time also.  She got sick the weekend after Christmas with a bad cough, but the phlegm had resolved, but still had some cough at the time of her visit.  She still has just a very mild intermittent, occasional cough.  Her BP was high at that visit, and she was told to monitor at home.  She actually has appt set up for next week with me to f/u on her blood pressures.  She brings in list of blood pressures checked  (taken with new monitor):  90-128/66-75 (one time had 128/102 ? Accuracy?).  She will bring this new monitor to next visit. She isn't having headaches, dizziness, palpitations.  Past Medical History  Diagnosis Date  . Contraceptive management   . Allergy   . Migraine   . Frequent UTI   . Adenomatous colon polyp 01/2009   Past Surgical History  Procedure Laterality Date  . Cesarean section    . Colonoscopy  01/10/2009    tubular adenoma (Dr. Collene Mares); repeat every 5 years   History   Social  History  . Marital Status: Married    Spouse Name: N/A    Number of Children: 1  . Years of Education: N/A   Occupational History  . unemployed (previously worked doing Investment banker, corporate)    Social History Main Topics  . Smoking status: Never Smoker   . Smokeless tobacco: Never Used  . Alcohol Use: Yes     Comment: 1-2 drinks once a month  . Drug Use: No  . Sexual Activity: Yes    Birth Control/ Protection: Pill   Other Topics Concern  . Not on file   Social History Narrative   1 daughter and 3 stepsons (1 of the three lives with her)    Current outpatient prescriptions:IRON PO, Take 130 mg by mouth daily., Disp: , Rfl: ;  Multiple Vitamins-Minerals (MULTI-VITAMIN GUMMIES PO), Take 1 each by mouth daily.  , Disp: , Rfl: ;  TRI-SPRINTEC 0.18/0.215/0.25 MG-35 MCG TABS, , Disp: , Rfl: ;  Azelastine HCl (ASTEPRO NA), Place 2 sprays into the nose daily.  , Disp: , Rfl: ;  cyclobenzaprine (FLEXERIL) 10 MG tablet, Take 10 mg by mouth daily.  , Disp: , Rfl:  Triamcinolone Acetonide (NASACORT AQ NA), Place 2 sprays into the nose daily.  , Disp: , Rfl:   No Known Allergies  ROS: Denies fevers, chills, nausea, vomiting, heartburn, indigestion, abdominal pain, diarrhea, swelling, bleeding, bruising, rashes.  Denies any URI symptoms currently.  PHYSICAL EXAM: BP 118/74  Pulse 104  Ht 5\' 1"  (1.549 m)  Wt 144 lb (65.318 kg)  BMI 27.22 kg/m2  LMP 08/09/2013 Well developed, pleasant female, in no distress.  She has frequent throat clearing during the visit HEENT:  PERRL, EOMI, conjunctiva clear  TM's and EAC's normal.  Nasal mucosa mildly edematous, no purulence or erythema.  Sinuses nontender.  OP clear Neck: no lymphadenopathy, thyromegaly or mass Heart: regular rate and rhythm without murmur Chest wall: nontender Abdomen: soft, nontender, no organomegaly or mass Extremities: no edema Skin: no rashes Psych: normal mood, affect, hygiene and grooming  EKG reviewed from last  visit.  ASSESSMENT/PLAN:  Chest pain  Cough  Given your frequent throat-clearing, and inflamed nasal mucus membranes, I suspect that you have some postnasal drainage.  This sometimes can cause chest congestion.  Use some claritin or zyrtec, and mucinex if needed (to thin the mucus, and help with the cough). I recommend a trial of anti-inflammatories for the chest pain, which is likely musculoskeletal.  Use Aleve twice daily for the next 5-7 days (or until pain resolved).  Or you can use ibuprofen (advil or motrin) 600-800mg  three times daily with food.  You can also try using some moist heat to the chest when it is hurting. Press around on the chest when you have the pain, to see if it tender to the touch (which is a sign of musculosketal pain).  If your chest pain and cough persists, we might need to get a chest x-ray.  Your blood pressure is much better.  F/u as scheduled for CPE, sooner if needed.

## 2013-08-23 NOTE — Patient Instructions (Signed)
  Given your frequent throat-clearing, and inflamed nasal mucus membranes, I suspect that you have some postnasal drainage.  This sometimes can cause chest congestion.  Use some claritin or zyrtec, and mucinex if needed (to thin the mucus, and help with the cough). I recommend a trial of anti-inflammatories for the chest pain, which is likely musculoskeletal.  Use Aleve twice daily for the next 5-7 days (or until pain resolved).  Or you can use ibuprofen (advil or motrin) 600-800mg  three times daily with food.  You can also try using some moist heat to the chest when it is hurting. Press around on the chest when you have the pain, to see if it tender to the touch (which is a sign of musculosketal pain).  If your chest pain and cough persists, we might need to get a chest x-ray.  Your blood pressure is much better.

## 2013-08-24 ENCOUNTER — Encounter: Payer: Self-pay | Admitting: Family Medicine

## 2013-08-30 ENCOUNTER — Encounter: Payer: Self-pay | Admitting: Family Medicine

## 2013-08-30 ENCOUNTER — Ambulatory Visit (INDEPENDENT_AMBULATORY_CARE_PROVIDER_SITE_OTHER): Payer: BC Managed Care – PPO | Admitting: Family Medicine

## 2013-08-30 VITALS — BP 110/70 | HR 100 | Ht 61.0 in | Wt 142.0 lb

## 2013-08-30 DIAGNOSIS — R079 Chest pain, unspecified: Secondary | ICD-10-CM

## 2013-08-30 DIAGNOSIS — K219 Gastro-esophageal reflux disease without esophagitis: Secondary | ICD-10-CM

## 2013-08-30 MED ORDER — DEXLANSOPRAZOLE 60 MG PO CPDR: 60.0000 mg | DELAYED_RELEASE_CAPSULE | Freq: Every day | ORAL | Status: DC

## 2013-08-30 NOTE — Progress Notes (Signed)
Chief Complaint  Patient presents with  . Follow-up    still having intermittent chest pains. Tried Dr.Sumiya Mamaril's recommendations but it still concerned.    She presents for 1 week follow-up on chest pain.  She has been paying much closer attention to her symptoms.  Chest pain occurs more after eating, more after lunch and dinner, not breakfast. Pain is usually on the right side, and sometimes is under her right breast, and sometimes is in the middle of her chest. Pain can last from a few seconds to 20-30 minutes.  She does have some belching throughout the day, but doesn't feel that this is any different than the norm for her (long before onset of chest pain).  Denies any change in diet, other than taking Emergen-C twice daily, as she feels this helps with her weakness/energy.  She tried heat, but that didn't help.  She took Aleve once daily for about 3 days, but didn't notice any improvement.  She took Zyrtec for the last week, but hasn't noticed any different.  She still finds herself clearing her throat periodically, not often, and only rare cough.  She has been taking emergen-C twice daily. This seems to help with her weakness/energy.  Past Medical History  Diagnosis Date  . Contraceptive management   . Allergy   . Migraine   . Frequent UTI   . Adenomatous colon polyp 01/2009   Past Surgical History  Procedure Laterality Date  . Cesarean section    . Colonoscopy  01/10/2009    tubular adenoma (Dr. Collene Mares); repeat every 5 years   History   Social History  . Marital Status: Married    Spouse Name: N/A    Number of Children: 1  . Years of Education: N/A   Occupational History  . unemployed (previously worked doing Investment banker, corporate)    Social History Main Topics  . Smoking status: Never Smoker   . Smokeless tobacco: Never Used  . Alcohol Use: Yes     Comment: 1-2 drinks once a month  . Drug Use: No  . Sexual Activity: Yes    Birth Control/ Protection: Pill   Other Topics  Concern  . Not on file   Social History Narrative   1 daughter and 3 stepsons (1 of the three lives with her)    Outpatient Encounter Prescriptions as of 08/30/2013  Medication Sig  . IRON PO Take 130 mg by mouth daily.  . Multiple Vitamins-Minerals (MULTI-VITAMIN GUMMIES PO) Take 1 each by mouth daily.    . TRI-SPRINTEC 0.18/0.215/0.25 MG-35 MCG TABS   . Azelastine HCl (ASTEPRO NA) Place 2 sprays into the nose daily.    . cyclobenzaprine (FLEXERIL) 10 MG tablet Take 10 mg by mouth daily.    . Triamcinolone Acetonide (NASACORT AQ NA) Place 2 sprays into the nose daily.      No Known Allergies  ROS:  Denies fevers, URI symptoms, signficant cough, shortness of breath, nausea, bowel changes, or other complaints.  See HPI  PHYSICAL EXAM: BP 110/70  Pulse 100  Ht 5\' 1"  (1.549 m)  Wt 142 lb (64.411 kg)  BMI 26.84 kg/m2  LMP 08/09/2013 Pt's machine showed 104/77 pulse 97 (accurate)  Well developed, pleasant female in no distress.  No throat-clearing or cough Chest: nontender to palpation Abdomen: soft, no epigastric tenderness. Negative Murphy.  No hepatosplenomegaly  ASSESSMENT/PLAN:  Chest pain - unchanged with NSAID and treatment of PND; now hx more c/w GERD.  Trial of Dexilant and behavioral changes  GERD (gastroesophageal reflux disease) - Plan: dexlansoprazole (DEXILANT) 60 MG capsule  Suspect reflux or other GI etiology for her chest pain (especially given lack of improvement with NSAID's and zyrtec, and now that history shows pain more related to food).  Exam and history not consistent with gall bladder issue.  Take the Dexilant once daily for the 10 days of samples.  If pain recurs after stopping, you may change over to an OTC product such as Prilosec or Nexium (coupon given for Nexium).  F/u at CPE, sooner prn

## 2013-08-30 NOTE — Patient Instructions (Addendum)
Gastroesophageal Reflux Disease, Adult Gastroesophageal reflux disease (GERD) happens when acid from your stomach flows up into the esophagus. When acid comes in contact with the esophagus, the acid causes soreness (inflammation) in the esophagus. Over time, GERD may create small holes (ulcers) in the lining of the esophagus. CAUSES   Increased body weight. This puts pressure on the stomach, making acid rise from the stomach into the esophagus.  Smoking. This increases acid production in the stomach.  Drinking alcohol. This causes decreased pressure in the lower esophageal sphincter (valve or ring of muscle between the esophagus and stomach), allowing acid from the stomach into the esophagus.  Late evening meals and a full stomach. This increases pressure and acid production in the stomach.  A malformed lower esophageal sphincter. Sometimes, no cause is found. SYMPTOMS   Burning pain in the lower part of the mid-chest behind the breastbone and in the mid-stomach area. This may occur twice a week or more often.  Trouble swallowing.  Sore throat.  Dry cough.  Asthma-like symptoms including chest tightness, shortness of breath, or wheezing. DIAGNOSIS  Your caregiver may be able to diagnose GERD based on your symptoms. In some cases, X-rays and other tests may be done to check for complications or to check the condition of your stomach and esophagus. TREATMENT  Your caregiver may recommend over-the-counter or prescription medicines to help decrease acid production. Ask your caregiver before starting or adding any new medicines.  HOME CARE INSTRUCTIONS   Change the factors that you can control. Ask your caregiver for guidance concerning weight loss, quitting smoking, and alcohol consumption.  Avoid foods and drinks that make your symptoms worse, such as:  Caffeine or alcoholic drinks.  Chocolate.  Peppermint or mint flavorings.  Garlic and onions.  Spicy foods.  Citrus fruits,  such as oranges, lemons, or limes.  Tomato-based foods such as sauce, chili, salsa, and pizza.  Fried and fatty foods.  Avoid lying down for the 3 hours prior to your bedtime or prior to taking a nap.  Eat small, frequent meals instead of large meals.  Wear loose-fitting clothing. Do not wear anything tight around your waist that causes pressure on your stomach.  Raise the head of your bed 6 to 8 inches with wood blocks to help you sleep. Extra pillows will not help.  Only take over-the-counter or prescription medicines for pain, discomfort, or fever as directed by your caregiver.  Do not take aspirin, ibuprofen, or other nonsteroidal anti-inflammatory drugs (NSAIDs). SEEK IMMEDIATE MEDICAL CARE IF:   You have pain in your arms, neck, jaw, teeth, or back.  Your pain increases or changes in intensity or duration.  You develop nausea, vomiting, or sweating (diaphoresis).  You develop shortness of breath, or you faint.  Your vomit is green, yellow, black, or looks like coffee grounds or blood.  Your stool is red, bloody, or black. These symptoms could be signs of other problems, such as heart disease, gastric bleeding, or esophageal bleeding. MAKE SURE YOU:   Understand these instructions.  Will watch your condition.  Will get help right away if you are not doing well or get worse. Document Released: 05/05/2005 Document Revised: 10/18/2011 Document Reviewed: 02/12/2011 [facility] Patient Information 2014 [place], [place].  Take the Dexilant once daily for the 10 days of samples.  If pain recurs after stopping, you may change over to an OTC product such as Prilosec or Nexium (coupon given for Nexium).

## 2013-09-26 ENCOUNTER — Ambulatory Visit (INDEPENDENT_AMBULATORY_CARE_PROVIDER_SITE_OTHER): Payer: BC Managed Care – PPO | Admitting: Family Medicine

## 2013-09-26 ENCOUNTER — Encounter: Payer: Self-pay | Admitting: Family Medicine

## 2013-09-26 VITALS — BP 120/64 | HR 92 | Ht 61.0 in | Wt 138.0 lb

## 2013-09-26 DIAGNOSIS — R079 Chest pain, unspecified: Secondary | ICD-10-CM

## 2013-09-26 DIAGNOSIS — R0600 Dyspnea, unspecified: Secondary | ICD-10-CM

## 2013-09-26 DIAGNOSIS — R0989 Other specified symptoms and signs involving the circulatory and respiratory systems: Secondary | ICD-10-CM

## 2013-09-26 DIAGNOSIS — R0609 Other forms of dyspnea: Secondary | ICD-10-CM

## 2013-09-26 LAB — CBC WITH DIFFERENTIAL/PLATELET
BASOS ABS: 0.1 10*3/uL (ref 0.0–0.1)
BASOS PCT: 1 % (ref 0–1)
Eosinophils Absolute: 0.1 10*3/uL (ref 0.0–0.7)
Eosinophils Relative: 1 % (ref 0–5)
HCT: 37.7 % (ref 36.0–46.0)
Hemoglobin: 13.2 g/dL (ref 12.0–15.0)
Lymphocytes Relative: 31 % (ref 12–46)
Lymphs Abs: 2 10*3/uL (ref 0.7–4.0)
MCH: 30.2 pg (ref 26.0–34.0)
MCHC: 35 g/dL (ref 30.0–36.0)
MCV: 86.3 fL (ref 78.0–100.0)
MONO ABS: 0.7 10*3/uL (ref 0.1–1.0)
Monocytes Relative: 11 % (ref 3–12)
NEUTROS ABS: 3.6 10*3/uL (ref 1.7–7.7)
NEUTROS PCT: 56 % (ref 43–77)
Platelets: 240 10*3/uL (ref 150–400)
RBC: 4.37 MIL/uL (ref 3.87–5.11)
RDW: 13.8 % (ref 11.5–15.5)
WBC: 6.4 10*3/uL (ref 4.0–10.5)

## 2013-09-26 NOTE — Progress Notes (Signed)
Chief Complaint  Patient presents with  . Follow-up    still having ongoing weakness, chest pain and some SOB.    Belching improved while on the Dexilant, but the pain in her chest didn't resolve. She never took Nexium or Prilosec, since no significant benefit was noted on the Dexilant.  SOB and chest pain persisted, despite taking Dexilant for 10 days, and didn't worsen upon finishing the samples.  She is noticing more shortness of breath with movement (ie with lifting, stairs).  Symptoms are intermittent.  She has stairs in her house; 50% of the time she might have some SOB with the stairs, and 50% of the time be fine.    Chest discomfort is longer associated with meals.  Denies any URI symptoms, has some mild chronic PND/throat-clearing.  Denies any nasal congestion.  Slight cough, nonproductive.  She is currently having some mild substernal burning sensation.  She doesn't feel as bad as she did back 1/2 when seen by [place].  If she doesn't take Emergen-C and drinking Naked smoothies, her energy is worse.  Chest pain, shortness of breath and fatigue/decreased energy all seems to occur together (although much better since on the supplements)  Past Medical History  Diagnosis Date  . Contraceptive management   . Allergy   . Migraine   . Frequent UTI   . Adenomatous colon polyp 01/2009   Past Surgical History  Procedure Laterality Date  . Cesarean section    . Colonoscopy  01/10/2009    tubular adenoma (Dr. Collene Mares); repeat every 5 years   History   Social History  . Marital Status: Married    Spouse Name: N/A    Number of Children: 1  . Years of Education: N/A   Occupational History  . unemployed (previously worked doing Investment banker, corporate)    Social History Main Topics  . Smoking status: Never Smoker   . Smokeless tobacco: Never Used  . Alcohol Use: Yes     Comment: 1-2 drinks once a month  . Drug Use: No  . Sexual Activity: Yes    Birth Control/ Protection: Pill   Other  Topics Concern  . Not on file   Social History Narrative   1 daughter and 3 stepsons (1 of the three lives with her)   Outpatient Encounter Prescriptions as of 09/26/2013  Medication Sig  . Ascorbic Acid (VITAMIN C PO) Take by mouth.  . Azelastine HCl (ASTEPRO NA) Place 2 sprays into the nose daily.    . IRON PO Take 130 mg by mouth daily.  . Multiple Vitamins-Minerals (MULTI-VITAMIN GUMMIES PO) Take 1 each by mouth daily.    . TRI-SPRINTEC 0.18/0.215/0.25 MG-35 MCG TABS   . Triamcinolone Acetonide (NASACORT AQ NA) Place 2 sprays into the nose daily.    . cyclobenzaprine (FLEXERIL) 10 MG tablet Take 10 mg by mouth daily.    . [DISCONTINUED] dexlansoprazole (DEXILANT) 60 MG capsule Take 1 capsule (60 mg total) by mouth daily.   No Known Allergies  ROS:  No fevers, chills, nausea, vomiting, bowel changes, bleeding, bruising, dysphagia, headaches, dizziness, edema, calf pain, urinary symptoms or other complaints, except as noted in HPI.  PHYSICAL EXAM: BP 120/64  Pulse 92  Ht 5\' 1"  (1.549 m)  Wt 138 lb (62.596 kg)  BMI 26.09 kg/m2 LMP 09/04/13 (had normal period; negative pregnancy test done at 1/2 visit) 93%02 sat with pulse of 86--took a while to work (hands were very cold), and ? Accuracy (wasn't a strong signal). HEENT:  PERRL ,EOMI, conjunctiva clear.  Occasional throat-clearing. OP clear Neck: no lymphadenopathy or mass Heart: regular rate and rhythm without murmur Lungs: clear bilaterally, good air movement. No wheezes, rales, ronchi Chest:  nontender to palpation Abdomen: soft, nontender, no organomegaly or mass.  Extremities: Negative homan.  Extremities without edema, tenderness, cords. Psych: normal mood, affect, hygiene and grooming Neuro: alert and oriented.  Cranial nerves grossly intact, normal gait, strength  ASSESSMENT/PLAN:  Dyspnea - Plan: CBC with Differential, DG Chest 2 View, D-dimer, quantitative  Chest pain - Plan: CBC with Differential, DG Chest 2 View,  D-dimer, quantitative  CXR, CBC and d-dimer.  If normal, send for PFT's.

## 2013-09-26 NOTE — Patient Instructions (Signed)
Go to [facility] tomorrow for chest x-ray.  If blood test and x-ray results are normal, we will refer you for pulmonary function testing (PFT's) to further evaluate your pain and shortness of breath.

## 2013-09-27 LAB — D-DIMER, QUANTITATIVE: D-Dimer, Quant: 0.35 ug/mL-FEU (ref 0.00–0.48)

## 2013-09-28 ENCOUNTER — Ambulatory Visit
Admission: RE | Admit: 2013-09-28 | Discharge: 2013-09-28 | Disposition: A | Discharge status: Home / Self Care | Payer: BC Managed Care – PPO | Source: Ambulatory Visit | Attending: Family Medicine | Admitting: Family Medicine

## 2013-09-28 ENCOUNTER — Other Ambulatory Visit: Payer: Self-pay | Admitting: Family Medicine

## 2013-09-28 DIAGNOSIS — R079 Chest pain, unspecified: Secondary | ICD-10-CM

## 2013-09-28 DIAGNOSIS — R0602 Shortness of breath: Secondary | ICD-10-CM

## 2013-09-28 DIAGNOSIS — R0600 Dyspnea, unspecified: Secondary | ICD-10-CM

## 2013-10-05 ENCOUNTER — Other Ambulatory Visit: Payer: Self-pay | Admitting: Family Medicine

## 2013-10-05 ENCOUNTER — Ambulatory Visit (INDEPENDENT_AMBULATORY_CARE_PROVIDER_SITE_OTHER): Payer: BC Managed Care – PPO | Admitting: Internal Medicine

## 2013-10-05 DIAGNOSIS — R0602 Shortness of breath: Secondary | ICD-10-CM

## 2013-10-05 LAB — PULMONARY FUNCTION TEST
DL/VA % pred: 131 %
DL/VA: 5.79 ml/min/mmHg/L
DLCO unc % pred: 100 %
DLCO unc: 20.38 ml/min/mmHg
FEF 25-75 POST: 2.7 L/s
FEF 25-75 Pre: 2.17 L/sec
FEF2575-%CHANGE-POST: 24 %
FEF2575-%PRED-POST: 112 %
FEF2575-%Pred-Pre: 90 %
FEV1-%Change-Post: 4 %
FEV1-%PRED-POST: 106 %
FEV1-%Pred-Pre: 101 %
FEV1-POST: 2.29 L
FEV1-Pre: 2.18 L
FEV1FVC-%CHANGE-POST: 2 %
FEV1FVC-%PRED-PRE: 100 %
FEV6-%Change-Post: 2 %
FEV6-%Pred-Post: 104 %
FEV6-%Pred-Pre: 102 %
FEV6-Post: 2.68 L
FEV6-Pre: 2.63 L
FEV6FVC-%Change-Post: 0 %
FEV6FVC-%Pred-Post: 102 %
FEV6FVC-%Pred-Pre: 103 %
FVC-%CHANGE-POST: 1 %
FVC-%Pred-Post: 101 %
FVC-%Pred-Pre: 99 %
FVC-PRE: 2.64 L
FVC-Post: 2.69 L
POST FEV1/FVC RATIO: 85 %
PRE FEV1/FVC RATIO: 83 %
PRE FEV6/FVC RATIO: 100 %
Post FEV6/FVC ratio: 100 %
RV % PRED: 106 %
RV: 1.67 L
TLC % pred: 90 %
TLC: 4.16 L

## 2013-10-05 NOTE — Progress Notes (Signed)
PFT done today. 

## 2013-11-01 ENCOUNTER — Encounter: Payer: Self-pay | Admitting: Family Medicine

## 2013-11-01 ENCOUNTER — Ambulatory Visit (INDEPENDENT_AMBULATORY_CARE_PROVIDER_SITE_OTHER): Payer: BC Managed Care – PPO | Admitting: Family Medicine

## 2013-11-01 VITALS — BP 112/72 | HR 88 | Ht 59.0 in | Wt 142.0 lb

## 2013-11-01 DIAGNOSIS — Z0000 Encounter for general adult medical examination without abnormal findings: Secondary | ICD-10-CM

## 2013-11-01 DIAGNOSIS — E7800 Pure hypercholesterolemia, unspecified: Secondary | ICD-10-CM

## 2013-11-01 DIAGNOSIS — R0989 Other specified symptoms and signs involving the circulatory and respiratory systems: Secondary | ICD-10-CM

## 2013-11-01 DIAGNOSIS — K219 Gastro-esophageal reflux disease without esophagitis: Secondary | ICD-10-CM

## 2013-11-01 DIAGNOSIS — J309 Allergic rhinitis, unspecified: Secondary | ICD-10-CM | POA: Insufficient documentation

## 2013-11-01 DIAGNOSIS — E559 Vitamin D deficiency, unspecified: Secondary | ICD-10-CM

## 2013-11-01 DIAGNOSIS — R0600 Dyspnea, unspecified: Secondary | ICD-10-CM

## 2013-11-01 DIAGNOSIS — R0609 Other forms of dyspnea: Secondary | ICD-10-CM

## 2013-11-01 LAB — LIPID PANEL
CHOL/HDL RATIO: 2.5 ratio
Cholesterol: 207 mg/dL — ABNORMAL HIGH (ref 0–200)
HDL: 84 mg/dL (ref >39)
LDL CALC: 110 mg/dL — AB (ref 0–99)
TRIGLYCERIDES: 64 mg/dL (ref <150)
VLDL: 13 mg/dL (ref 0–40)

## 2013-11-01 LAB — GLUCOSE, RANDOM: Glucose, Bld: 75 mg/dL (ref 70–99)

## 2013-11-01 MED ORDER — ALBUTEROL SULFATE HFA 108 (90 BASE) MCG/ACT IN AERS: 2.0000 | INHALATION_SPRAY | Freq: Four times a day (QID) | RESPIRATORY_TRACT | Status: DC | PRN

## 2013-11-01 MED ORDER — TRIAMCINOLONE ACETONIDE 55 MCG/ACT NA AERO: 2.0000 | INHALATION_SPRAY | Freq: Every day | NASAL | Status: DC

## 2013-11-01 NOTE — Progress Notes (Signed)
Chief Complaint  Patient presents with  . Annual Exam    fasting annual exam, no pap-sees Dr.Kaplan and is UTD. Did not do UA as she is on her cycle. Only thing other than well health she woul dlike to address is just to follow up on chest pain/dyspnea.   Amy Hill is a 48 y.o. female who presents for a complete physical.  She has the following concerns:  Yesterday she was burping a lot.  She had been put on Dexilant, which helped with belching, but she never started any OTC PPI.  Denies any significant heartburn/reflux, just intermittent belching. Her shortness of breath persists, intermittently.  She had some dyspnea after walking up 3 flights of stairs at her daughters school, but this morning didn't have much of a problem with it, recovered faster.  She is no longer getting any chest pain as often, but still gets some intermittently, like a pinch, very short-lived.  She had chest x-ray and PFT's--normal but some mild improvement after bronchodilator.   Immunization History  Administered Date(s) Administered  . Td 04/09/2002  . Tdap 01/21/2011   Last Pap smear: through GYN, last year, UTD Last mammogram: through GYN, done last year, UTD Last colonoscopy: 01/2009, Dr. Collene Mares; she recalls being told she was due to f/u in 5 years Last DEXA: never Ophtho: over 6-7 years ago Dentist: last year Exercise: irregular  Past Medical History  Diagnosis Date  . Contraceptive management   . Allergy   . Migraine   . Frequent UTI   . Adenomatous colon polyp 01/2009    Past Surgical History  Procedure Laterality Date  . Cesarean section    . Colonoscopy  01/10/2009    tubular adenoma (Dr. Collene Mares); repeat every 5 years    History   Social History  . Marital Status: Married    Spouse Name: N/A    Number of Children: 1  . Years of Education: N/A   Occupational History  . payroll/accounting    Social History Main Topics  . Smoking status: Never Smoker   . Smokeless tobacco: Never Used   . Alcohol Use: Yes     Comment: 1-2 drinks once a month  . Drug Use: No  . Sexual Activity: Yes    Partners: Male    Birth Control/ Protection: Pill   Other Topics Concern  . Not on file   Social History Narrative   Lives with husband (out of town a lot, Administrator) and 1 daughter.  Works doing accounting/payroll (high stress job)    Family History  Problem Relation Age of Onset  . Hyperlipidemia Mother   . Hypertension Mother   . Colon polyps Father   . Hyperlipidemia Father   . Hypertension Father   . Gout Father   . Heart disease Maternal Grandfather   . Cancer Neg Hx   . Diabetes Neg Hx    Outpatient Encounter Prescriptions as of 11/01/2013  Medication Sig Note  . Ascorbic Acid (VITAMIN C PO) Take by mouth. 11/01/2013: Emergen-C packet every other day or so  . Azelastine HCl (ASTEPRO NA) Place 2 sprays into the nose daily.     . IRON PO Take 130 mg by mouth daily.   . Multiple Vitamins-Minerals (MULTI-VITAMIN GUMMIES PO) Take 1 each by mouth daily.     . TRI-SPRINTEC 0.18/0.215/0.25 MG-35 MCG TABS    . [DISCONTINUED] Triamcinolone Acetonide (NASACORT AQ NA) Place 2 sprays into the nose daily.     [name] Kitchen albuterol (  PROVENTIL HFA;VENTOLIN HFA) 108 (90 BASE) MCG/ACT inhaler Inhale 2 puffs into the lungs every 6 (six) hours as needed for wheezing or shortness of breath. You can use 15 mins prior to exercise also   . triamcinolone (NASACORT) 55 MCG/ACT AERO nasal inhaler Place 2 sprays into the nose daily.   . [DISCONTINUED] cyclobenzaprine (FLEXERIL) 10 MG tablet Take 10 mg by mouth daily.      No Known Allergies  ROS:  The patient denies anorexia, fever, weight changes, headaches,  vision changes, decreased hearing, ear pain, sore throat, breast concerns, palpitations, dizziness, syncope, cough, swelling, nausea, vomiting, diarrhea, constipation, abdominal pain, melena, hematochezia, hematuria, incontinence, dysuria, irregular menstrual cycles, vaginal discharge, odor or itch,  genital lesions, joint pains, numbness, tingling, weakness, tremor, suspicious skin lesions, depression, anxiety, abnormal bleeding/bruising, or enlarged lymph nodes. Allergies are controlled with medications. Occasionally notices her heart racing, not related to exertion, lasting 10 mins. +chest pain, belching, dyspnea per HPI  PHYSICAL EXAM: BP 112/72  Pulse 88  Ht 4\' 11"  (1.499 m)  Wt 142 lb (64.411 kg)  BMI 28.67 kg/m2  LMP 11/01/2013  General Appearance:  Alert, cooperative, no distress, appears stated age   Head:  Normocephalic, without obvious abnormality, atraumatic   Eyes:  PERRL, conjunctiva/corneas clear, EOM's intact, fundi  benign   Ears:  Normal TM's and external ear canals   Nose:  Nares normal, mucosa normal, no drainage or sinus tenderness   Throat:  Lips, mucosa, and tongue normal; teeth and gums normal   Neck:  Supple, no lymphadenopathy; thyroid: no enlargement/tenderness/nodules; no carotid  bruit or JVD   Back:  Spine nontender, no curvature, ROM normal, no CVA tenderness   Lungs:  Clear to auscultation bilaterally without wheezes, rales or ronchi; respirations unlabored   Chest Wall:  No tenderness or deformity   Heart:  Regular rate and rhythm, S1 and S2 normal, no murmur, rub  or gallop   Breast Exam:  Deferred to GYN   Abdomen:  Soft, non-tender, nondistended, normoactive bowel sounds,  no masses, no hepatosplenomegaly   Genitalia:  Deferred to GYN      Extremities:  No clubbing, cyanosis or edema   Pulses:  2+ and symmetric all extremities   Skin:  Skin color, texture, turgor normal, no rashes or lesions   Lymph nodes:  Cervical, supraclavicular, and axillary nodes normal   Neurologic:  CNII-XII intact, normal strength, sensation and gait; reflexes 2+ and symmetric throughout          Psych: Normal mood, affect, hygiene and grooming.    Lab Results  Component Value Date   WBC 6.4 09/26/2013   HGB 13.2 09/26/2013   HCT 37.7 09/26/2013   MCV 86.3  09/26/2013   PLT 240 09/26/2013     Chemistry      Component Value Date/Time   NA 138 08/10/2013 1053   K 3.6 08/10/2013 1053   CL 103 08/10/2013 1053   CO2 25 08/10/2013 1053   BUN 8 08/10/2013 1053   CREATININE 0.79 08/10/2013 1053      Component Value Date/Time   CALCIUM 8.8 08/10/2013 1053   ALKPHOS 55 01/21/2011 1543   AST 18 01/21/2011 1543   ALT 13 01/21/2011 1543   BILITOT 0.5 01/21/2011 1543     Lab Results  Component Value Date   TSH 1.445 08/10/2013   Vitamin D-OH in 01/2011 was low at 23.  ASSESSMENT/PLAN:  Routine general medical examination at a health care facility - Plan: Visual acuity  screening, Lipid panel, Glucose, random, Vit D  25 hydroxy (rtn osteoporosis monitoring)  Pure hypercholesterolemia - Plan: Lipid panel  Unspecified vitamin D deficiency - Plan: Vit D  25 hydroxy (rtn osteoporosis monitoring)  Allergic rhinitis, cause unspecified - Plan: triamcinolone (NASACORT) 55 MCG/ACT AERO nasal inhaler  Dyspnea - normal PFT's but some improvement after BD.  discussed GERD vs allergies as trigger.  Treat for both.  Sx intermittent, mild - Plan: albuterol (PROVENTIL HFA;VENTOLIN HFA) 108 (90 BASE) MCG/ACT inhaler  GERD (gastroesophageal reflux disease)  Discussed monthly self breast exams and yearly mammograms after the age of 57; at least 30 minutes of aerobic activity at least 5 days/week; proper sunscreen use reviewed; healthy diet, including goals of calcium and vitamin D intake and alcohol recommendations (less than or equal to 1 drink/day) reviewed; regular seatbelt use; changing batteries in smoke detectors.  Immunization recommendations discussed.  Flu shots recommended yearly. Colonoscopy due in June  Start Prilosec or Nexium (OTC) once daily.  It is possible that your reflux is contributing to some wheezing/shortness of breath.  You may also just have an exertional component to wheezing. So, I'm giving you an inhaler to use as needed for shortness of breath/wheezing,  but also to try using 15-20 minutes prior to exercise, to see if that helps prevent shortness of breath with exercise.  Pay closer attention to your spells of heart racing--check your pulse (beats per min--okay to check for 15 seconds and multiply x 4).  Do you feel lightheaded, chest pain, short of breath.  How long does it last?  Start and/or stop suddenly vs gradually?  Related to caffeine? Decongestants (sinus meds)? Dehydration? Stress/anxiety?  If you are having this frequently, and especially if pulse is over 100 (and over 150 for sure), then return with your journal, as you likely will need further evaluation.

## 2013-11-01 NOTE — Patient Instructions (Signed)
  HEALTH MAINTENANCE RECOMMENDATIONS:  It is recommended that you get at least 30 minutes of aerobic exercise at least 5 days/week (for weight loss, you may need as much as 60-90 minutes). This can be any activity that gets your heart rate up. This can be divided in 10-15 minute intervals if needed, but try and build up your endurance at least once a week.  Weight bearing exercise is also recommended twice weekly.  Eat a healthy diet with lots of vegetables, fruits and fiber.  "Colorful" foods have a lot of vitamins (ie green vegetables, tomatoes, red peppers, etc).  Limit sweet tea, regular sodas and alcoholic beverages, all of which has a lot of calories and sugar.  Up to 1 alcoholic drink daily may be beneficial for women (unless trying to lose weight, watch sugars).  Drink a lot of water.  Calcium recommendations are 1200-1500 mg daily (1500 mg for postmenopausal women or women without ovaries), and vitamin D 1000 IU daily.  This should be obtained from diet and/or supplements (vitamins), and calcium should not be taken all at once, but in divided doses.  Monthly self breast exams and yearly mammograms for women over the age of 3 is recommended.  Sunscreen of at least SPF 30 should be used on all sun-exposed parts of the skin when outside between the hours of 10 am and 4 pm (not just when at beach or pool, but even with exercise, golf, tennis, and yard work!)  Use a sunscreen that says "broad spectrum" so it covers both UVA and UVB rays, and make sure to reapply every 1-2 hours.  Remember to change the batteries in your smoke detectors when changing your clock times in the spring and fall.  Use your seat belt every time you are in a car, and please drive safely and not be distracted with cell phones and texting while driving.   Start Prilosec or Nexium (OTC) once daily.  It is possible that your reflux is contributing to some wheezing/shortness of breath.  You may also just have an exertional  component to wheezing. So, I'm giving you an inhaler to use as needed for shortness of breath/wheezing, but also to try using 15-20 minutes prior to exercise, to see if that helps prevent shortness of breath with exercise.  Pay closer attention to your spells of heart racing--check your pulse (beats per min--okay to check for 15 seconds and multiply x 4).  Do you feel lightheaded, chest pain, short of breath.  How long does it last?  Start and/or stop suddenly vs gradually?  Related to caffeine? Decongestants (sinus meds)? Dehydration? Stress/anxiety?  If you are having this frequently, and especially if pulse is over 100 (and over 150 for sure), then return with your journal, as you likely will need further evaluation.

## 2013-11-02 LAB — VITAMIN D 25 HYDROXY (VIT D DEFICIENCY, FRACTURES): Vit D, 25-Hydroxy: 34 ng/mL (ref 30–89)

## 2013-11-15 ENCOUNTER — Other Ambulatory Visit: Payer: Self-pay | Admitting: *Deleted

## 2013-11-15 ENCOUNTER — Telehealth: Payer: Self-pay | Admitting: Internal Medicine

## 2013-11-15 MED ORDER — SPACER/AERO CHAMBER MOUTHPIECE MISC: 1.0000 | Status: DC

## 2013-11-15 NOTE — Telephone Encounter (Signed)
Pt states that she needs an order for a mouth piece for her inhaler pump. Send to [address]

## 2013-11-15 NOTE — Telephone Encounter (Signed)
I'm assuming this means aerochamber (spacer).  I didn't think rx was needed.  Okay to call in, if needed.

## 2013-11-15 NOTE — Telephone Encounter (Signed)
Sent rx in

## 2014-05-24 ENCOUNTER — Other Ambulatory Visit: Payer: Self-pay

## 2014-06-10 ENCOUNTER — Encounter: Payer: Self-pay | Admitting: Family Medicine

## 2014-10-23 ENCOUNTER — Ambulatory Visit (INDEPENDENT_AMBULATORY_CARE_PROVIDER_SITE_OTHER): Payer: BLUE CROSS/BLUE SHIELD | Admitting: Family Medicine

## 2014-10-23 ENCOUNTER — Encounter: Payer: Self-pay | Admitting: Family Medicine

## 2014-10-23 VITALS — BP 118/80 | HR 84 | Ht 59.0 in | Wt 151.0 lb

## 2014-10-23 DIAGNOSIS — R079 Chest pain, unspecified: Secondary | ICD-10-CM | POA: Diagnosis not present

## 2014-10-23 DIAGNOSIS — K219 Gastro-esophageal reflux disease without esophagitis: Secondary | ICD-10-CM | POA: Diagnosis not present

## 2014-10-23 DIAGNOSIS — J309 Allergic rhinitis, unspecified: Secondary | ICD-10-CM | POA: Diagnosis not present

## 2014-10-23 DIAGNOSIS — F411 Generalized anxiety disorder: Secondary | ICD-10-CM | POA: Diagnosis not present

## 2014-10-23 NOTE — Patient Instructions (Signed)
If you continue to have chest pain (and if related to food/eating, ongoing belching), consider a trial of prilosec or Nexium for 1-2 weeks.  You might have an esophageal component, given the fact that you were tender in your stomach.  Much of your symptoms may be related to stress.  Work on stress reduction techniques as we discussed.  Consider counseling.

## 2014-10-23 NOTE — Progress Notes (Signed)
Chief Complaint  Patient presents with  . Chest Pain    intermittent chest pain over the last 2-3 days. Comes and goes about 8-10 times per day and only last a few seconds. No SOB. Sunday and yesterday patient reports that her bp was elevated 130's-150's/80-96. Has has slight headache.    Three days ago she started having some dull pains in her chest--sometimes in the center, other times on either side, variable.  Pain did not occur with exertion.The last couple of days she continues to have pain.  She has a stressful job, and while very busy at work she noticed some discomfort, multiple times throughout the day, lasting just a few seconds.  No associated shortness of breath.  Sometimes feels like her heart races, but not always associated with the discomfort.  No significant palpitations. She has described similar "pinching" sensation and palpitations in the past, last discussed at her physical a year ago.  Now might have pain if she leans a certain way, or startled/excited about something, and when stressed at work.  She has a very stressful job--thinks it is related  No change in activity, falls or trauma.  Denies heartburn. Hasn't taken any medications for it.  Relaxing seems to help.  She noticed her BP up a little on Sunday and yesterday, was fine on Monday. Yesterday and Sunday she had a headache in the back of her head/neck.  She took Tylenol on Sunday for the headache.  Yesterday the headache went away on its own, with sleeping/relaxing. She doesn't have a h/o HTN.  PMH, PSH, SH and family history reviewed.  Outpatient Encounter Prescriptions as of 10/23/2014  Medication Sig Note  . Ascorbic Acid (VITAMIN C PO) Take by mouth. 11/01/2013: Emergen-C packet every other day or so  . IRON PO Take 130 mg by mouth daily.   . Multiple Vitamins-Minerals (MULTI-VITAMIN GUMMIES PO) Take 1 each by mouth daily.     [name] Kitchen Spacer/Aero Chamber Mouthpiece MISC 1 each by Does not apply route as directed.   .  TRI-SPRINTEC 0.18/0.215/0.25 MG-35 MCG TABS    . triamcinolone (NASACORT) 55 MCG/ACT AERO nasal inhaler Place 2 sprays into the nose daily. 10/23/2014: Uses sporadically, prn (not in the last 2-3 days)  . albuterol (PROVENTIL HFA;VENTOLIN HFA) 108 (90 BASE) MCG/ACT inhaler Inhale 2 puffs into the lungs every 6 (six) hours as needed for wheezing or shortness of breath. You can use 15 mins prior to exercise also (Patient not taking: Reported on 10/23/2014)   . [DISCONTINUED] Azelastine HCl (ASTEPRO NA) Place 2 sprays into the nose daily.      No Known Allergies  ROS:  Some runny nose, itchy eyes and sneezing.  No fevers, chills. +intermittent posterior headaches. No dizziness, URI symptoms, cough, shortness of breath, nausea, vomiting, bowel changes, bleeding, bruising, rash, urinary complaints, joint pains or other concerns, except as noted in HPI  PHYSICAL EXAM: BP 118/80 mmHg  Pulse 84  Ht 4\' 11"  (1.499 m)  Wt 151 lb (68.493 kg)  BMI 30.48 kg/m2  LMP 10/02/2014  Well developed, pleasant female in no distress. She appears comfortable Neck: no lymphadenopathy, thyromegaly, carotid bruit or mass Heart: regular rate and rhythm without murmur Lungs: clear bilaterally Chest: nontender to palpation Abdomen: soft, no organomegaly or mass.  +Epigastric tenderness, some belching during visit Extremities: no clubbing, cyanosis or edema  EKG:  NSR rate 86.  Some nonspecific changes (borderline LAE, possible LVH; slight PR depression, but no ST elevations).  ASSESSMENT/PLAN:  Chest  pain, unspecified chest pain type - not likely cardiac, possible GI vs anxiety vs MSK although not reproducible - Plan: EKG 12-Lead  Anxiety state - discussed stress reduction techniques; consider counseling  Gastroesophageal reflux disease without esophagitis - belching and epigastric tenderness. Discussed OTC PPI's  Allergic rhinitis, unspecified allergic rhinitis type - discussed proper use of nasal steroids--to use  daily, not prn  Discussed stress reduction, relaxation techniques.  Consider counseling  Allergies--discussed proper use of nasal steroids, use daily during season.  May use antihistamine prn

## 2014-10-30 ENCOUNTER — Encounter: Payer: Self-pay | Admitting: Family Medicine

## 2014-10-30 ENCOUNTER — Ambulatory Visit (INDEPENDENT_AMBULATORY_CARE_PROVIDER_SITE_OTHER): Payer: BLUE CROSS/BLUE SHIELD | Admitting: Family Medicine

## 2014-10-30 VITALS — BP 126/82 | HR 100 | Ht 59.0 in | Wt 148.4 lb

## 2014-10-30 DIAGNOSIS — R002 Palpitations: Secondary | ICD-10-CM

## 2014-10-30 DIAGNOSIS — R079 Chest pain, unspecified: Secondary | ICD-10-CM

## 2014-10-30 DIAGNOSIS — R0600 Dyspnea, unspecified: Secondary | ICD-10-CM

## 2014-10-30 LAB — COMPREHENSIVE METABOLIC PANEL
ALBUMIN: 3.7 g/dL (ref 3.5–5.2)
ALT: 21 U/L (ref 0–35)
AST: 24 U/L (ref 0–37)
Alkaline Phosphatase: 42 U/L (ref 39–117)
BUN: 10 mg/dL (ref 6–23)
CALCIUM: 9.6 mg/dL (ref 8.4–10.5)
CO2: 28 mEq/L (ref 19–32)
Chloride: 100 mEq/L (ref 96–112)
Creat: 0.72 mg/dL (ref 0.50–1.10)
Glucose, Bld: 87 mg/dL (ref 70–99)
POTASSIUM: 4.6 meq/L (ref 3.5–5.3)
SODIUM: 135 meq/L (ref 135–145)
TOTAL PROTEIN: 8.4 g/dL — AB (ref 6.0–8.3)
Total Bilirubin: 0.5 mg/dL (ref 0.2–1.2)

## 2014-10-30 LAB — CBC WITH DIFFERENTIAL/PLATELET
Basophils Absolute: 0.1 10*3/uL (ref 0.0–0.1)
Basophils Relative: 1 % (ref 0–1)
Eosinophils Absolute: 0.1 10*3/uL (ref 0.0–0.7)
Eosinophils Relative: 1 % (ref 0–5)
HCT: 41 % (ref 36.0–46.0)
Hemoglobin: 13.8 g/dL (ref 12.0–15.0)
LYMPHS PCT: 42 % (ref 12–46)
Lymphs Abs: 2.4 10*3/uL (ref 0.7–4.0)
MCH: 30.2 pg (ref 26.0–34.0)
MCHC: 33.7 g/dL (ref 30.0–36.0)
MCV: 89.7 fL (ref 78.0–100.0)
MPV: 11.9 fL (ref 8.6–12.4)
Monocytes Absolute: 0.6 10*3/uL (ref 0.1–1.0)
Monocytes Relative: 11 % (ref 3–12)
NEUTROS ABS: 2.6 10*3/uL (ref 1.7–7.7)
NEUTROS PCT: 45 % (ref 43–77)
PLATELETS: 252 10*3/uL (ref 150–400)
RBC: 4.57 MIL/uL (ref 3.87–5.11)
RDW: 13.3 % (ref 11.5–15.5)
WBC: 5.7 10*3/uL (ref 4.0–10.5)

## 2014-10-30 LAB — TSH: TSH: 1.192 u[IU]/mL (ref 0.350–4.500)

## 2014-10-30 NOTE — Patient Instructions (Addendum)
  Given your tenderness on exam in your chest area (where the ribs meet up with the sternum), I think you have inflammation of the cartilage. Take 2 Aleve twice daily with food for a week, and this should help calm down the pain. You should remain on the Nexium (OTC) daily while on the anti-inflammatory so it doesn't bother your stomach.  We checking bloodwork today to evaluate for anemia, electrolyte disturbances, as well as kidney, liver and thyroid tests.  Go to [facility] tomorrow for repeat chest x-ray, so we can make sure there is no underlying lung or other problem.   I feel like there is also a component of anxiety contributing at least in part.  Let us know if you would like alprazolam (xanax) to use as needed when really feeling anxious, heart racing, short of breath, to see how that helps for you.  It might be that having a normal chest x-ray, normal labs, and treating at least part of the pain etiology (with the aleve) might help you feel much better.  If you change your mind, call us for prescription. It is important that you aren't driving when you take this medication, as it can make you sleepy.

## 2014-10-30 NOTE — Progress Notes (Signed)
Chief Complaint  Patient presents with  . Chest Pain    and some SOB. Chest pain is more frequent and upon exertion. Also some back pain. Also feels like her heart is racing at least once a day.     She was seen last week with complaints of chest pain. Please refer back to HPI from 3/16 visit).  She has been taking Nexium OTC once daily--belching less. Never had any abdominal discomfort (just found to be tender on exam).  Didn't seem to help with the chest pain.  She now notices that she is having pain while washing her daughter's hair. Associated with shortness of breath (lasted about 20-30 mins, while doing hair, better when she completed activity and "relaxed").  If she is very tired, she also seems to have more pain.  Also hurts with walking up or down the steps ("I could feel the pain a little bit"), not necessarily with extreme exertion.  She continues to walk regularly, and she hasn't had any pain with more intense exertion.  She only once noted it on the bike (not every time).  She did not use the albuterol with any instances of shortness of breath.  She had PFT's done in February 2015  for shortness of breath--minimal small airway obstruction, suggesting small airway disease.  No response to bronchodilator. CXR done 09/2013 was normal (at that point was having SOB, not the chest pain like she is currently having.)  She reports some twitching in the muscles of her face, and sometimes her calves.  Denies swelling, cramps.  Going to [employer] next week.  Stressing herself out about what is causing her symptoms, whether or not she should go.  PMH, PSH, SH, FH reviewed. Outpatient Encounter Prescriptions as of 10/30/2014  Medication Sig Note  . Ascorbic Acid (VITAMIN C PO) Take by mouth. 11/01/2013: Emergen-C packet every other day or so  . IRON PO Take 130 mg by mouth daily.   . Multiple Vitamins-Minerals (MULTI-VITAMIN GUMMIES PO) Take 1 each by mouth daily.     . TRI-SPRINTEC  0.18/0.215/0.25 MG-35 MCG TABS    . triamcinolone (NASACORT) 55 MCG/ACT AERO nasal inhaler Place 2 sprays into the nose daily. 10/23/2014: Uses sporadically, prn (not in the last 2-3 days)  . albuterol (PROVENTIL HFA;VENTOLIN HFA) 108 (90 BASE) MCG/ACT inhaler Inhale 2 puffs into the lungs every 6 (six) hours as needed for wheezing or shortness of breath. You can use 15 mins prior to exercise also (Patient not taking: Reported on 10/23/2014)   . Spacer/Aero Chamber Mouthpiece MISC 1 each by Does not apply route as directed. (Patient not taking: Reported on 10/30/2014)    No Known Allergies  ROS: no fevers, chills, URI symptoms, headaches, dizziness, neuro complaints.  No nausea, vomiting, abdominal pain, bowel changes, urinary complaints. +chest pain and dyspnea as per HPI.  +stressful job.  Denies depression.  No bleeding, bruising, rash.  See HPI.  PHYSICAL EXAM: BP 126/82 mmHg  Pulse 100  Ht 4' 11"  (1.499 m)  Wt 148 lb 6.4 oz (67.314 kg)  BMI 29.96 kg/m2  SpO2 99%  LMP 10/25/2014  Well developed, pleasant female in no distress. She appears comfortable Neck: no lymphadenopathy, thyromegaly, carotid bruit or mass Heart: regular rate and rhythm without murmur Lungs: clear bilaterally Chest: focal area of tenderness along left costochondral junction just above the level of her underwire.  No erythema or swelling noted.  nontender on the right.  Abdomen: soft, no organomegaly or mass.no longer tender at  epigastrium. no belching during visit Extremities: no clubbing, cyanosis or edema  ASSESSMENT/PLAN:  Chest pain, unspecified chest pain type - suspect component of costochondritis, as well as anxiety - Plan: Comprehensive metabolic panel, CBC with Differential/Platelet, TSH, DG Chest 2 View  Dyspnea - previously had PFT's. check CXR and r/o anemia - Plan: CBC with Differential/Platelet, DG Chest 2 View  Palpitations - Plan: TSH    CBC, TSH, c-met CXR   Given your tenderness on exam  in your chest area (where the ribs meet up with the sternum), I think you have inflammation of the cartilage. Take 2 Aleve twice daily with food for a week, and this should help calm down the pain. You should remain on the Nexium (OTC) daily while on the anti-inflammatory so it doesn't bother your stomach.  We checking bloodwork today to evaluate for anemia, electrolyte disturbances, as well as kidney, liver and thyroid tests.  Go to [facility] tomorrow for repeat chest x-ray, so we can make sure there is no underlying lung or other problem.  I feel like there is also a component of anxiety contributing at least in part.  Let us know if you would like alprazolam (xanax) to use as needed when really feeling anxious, heart racing, short of breath, to see how that helps for you.  It might be that having a normal chest x-ray, normal labs, and treating at least part of the pain etiology (with the aleve) might help you feel much better.  If you change your mind, call us for prescription. It is important that you aren't driving when you take this medication, as it can make you sleepy.

## 2014-10-31 ENCOUNTER — Ambulatory Visit
Admission: RE | Admit: 2014-10-31 | Discharge: 2014-10-31 | Disposition: A | Discharge status: Home / Self Care | Payer: BLUE CROSS/BLUE SHIELD | Source: Ambulatory Visit | Attending: Family Medicine | Admitting: Family Medicine

## 2014-10-31 ENCOUNTER — Encounter: Payer: Self-pay | Admitting: Family Medicine

## 2014-10-31 DIAGNOSIS — R0600 Dyspnea, unspecified: Secondary | ICD-10-CM

## 2014-10-31 DIAGNOSIS — R079 Chest pain, unspecified: Secondary | ICD-10-CM

## 2014-10-31 MED ORDER — AZITHROMYCIN 250 MG PO TABS: ORAL_TABLET | ORAL | Status: DC

## 2014-10-31 NOTE — Addendum Note (Signed)
Addended by: Rita Ohara on: 10/31/2014 05:36 PM   Modules accepted: Orders

## 2014-11-01 ENCOUNTER — Encounter: Payer: Self-pay | Admitting: Family Medicine

## 2014-11-04 ENCOUNTER — Telehealth: Payer: Self-pay | Admitting: Internal Medicine

## 2014-11-04 DIAGNOSIS — R0600 Dyspnea, unspecified: Secondary | ICD-10-CM

## 2014-11-04 MED ORDER — SPACER/AERO CHAMBER MOUTHPIECE MISC: 1.0000 | Status: DC

## 2014-11-04 MED ORDER — ALBUTEROL SULFATE HFA 108 (90 BASE) MCG/ACT IN AERS: 2.0000 | INHALATION_SPRAY | Freq: Four times a day (QID) | RESPIRATORY_TRACT | Status: DC | PRN

## 2014-11-04 NOTE — Telephone Encounter (Signed)
done

## 2014-11-04 NOTE — Telephone Encounter (Signed)
Ok to refill 

## 2014-11-04 NOTE — Telephone Encounter (Signed)
-----   Message from Rita Ohara, MD sent at 11/04/2014  8:24 AM EDT ----- Regarding: unread message from Friday This is the content of the message that was sent:  "I tried to reach you by phone. I hope that you have viewed your test results (lab and x-ray).  Given that the x-ray findings are different than last year, let's treat with a z-pak (I sent to your pharmacy).  We should recheck x-ray in 3-4 weeks to ensure the abnormality noted on x-ray has cleared.  Since it does show some hyperinflation, I recommend trying to use the albuterol inhaler that you have when you feel short of breath.  Follow up next week if you aren't any better, and we can do pulmonary function tests in the office.   I hope this helps resolve your symptoms!"   I know that she saw her results, and started the antibiotic, and she since asked other questions (about taking NSAIDs, etc)--but it appears she didn't see this message.  Please read it to her, as I'm not sure she knows about using the inhaler, or to f/u if not improving, and to set up f/u visit in 3 weeks, at which time we can schedule CXR.

## 2014-11-04 NOTE — Telephone Encounter (Signed)
She did view test results- and start the antibiotic, she did use the inhaler on Saturday and helped but it is now expired and she would like a refill on the albuterol inhaler and the spacer/aero chamber to [address]. She has scheduled a follow-up appt on April 18th with you.

## 2014-11-25 ENCOUNTER — Ambulatory Visit: Payer: BLUE CROSS/BLUE SHIELD | Admitting: Family Medicine

## 2014-11-28 ENCOUNTER — Ambulatory Visit (INDEPENDENT_AMBULATORY_CARE_PROVIDER_SITE_OTHER): Payer: BLUE CROSS/BLUE SHIELD | Admitting: Family Medicine

## 2014-11-28 ENCOUNTER — Encounter: Payer: Self-pay | Admitting: Family Medicine

## 2014-11-28 VITALS — BP 130/80 | HR 80 | Ht 59.0 in | Wt 149.2 lb

## 2014-11-28 DIAGNOSIS — R0600 Dyspnea, unspecified: Secondary | ICD-10-CM

## 2014-11-28 DIAGNOSIS — Z0000 Encounter for general adult medical examination without abnormal findings: Secondary | ICD-10-CM | POA: Diagnosis not present

## 2014-11-28 DIAGNOSIS — K219 Gastro-esophageal reflux disease without esophagitis: Secondary | ICD-10-CM

## 2014-11-28 DIAGNOSIS — J309 Allergic rhinitis, unspecified: Secondary | ICD-10-CM | POA: Diagnosis not present

## 2014-11-28 NOTE — Patient Instructions (Signed)
  HEALTH MAINTENANCE RECOMMENDATIONS:  It is recommended that you get at least 30 minutes of aerobic exercise at least 5 days/week (for weight loss, you may need as much as 60-90 minutes). This can be any activity that gets your heart rate up. This can be divided in 10-15 minute intervals if needed, but try and build up your endurance at least once a week.  Weight bearing exercise is also recommended twice weekly.  Eat a healthy diet with lots of vegetables, fruits and fiber.  "Colorful" foods have a lot of vitamins (ie green vegetables, tomatoes, red peppers, etc).  Limit sweet tea, regular sodas and alcoholic beverages, all of which has a lot of calories and sugar.  Up to 1 alcoholic drink daily may be beneficial for women (unless trying to lose weight, watch sugars).  Drink a lot of water.  Calcium recommendations are 1200-1500 mg daily (1500 mg for postmenopausal women or women without ovaries), and vitamin D 1000 IU daily.  This should be obtained from diet and/or supplements (vitamins), and calcium should not be taken all at once, but in divided doses.  Monthly self breast exams and yearly mammograms for women over the age of 77 is recommended.  Sunscreen of at least SPF 30 should be used on all sun-exposed parts of the skin when outside between the hours of 10 am and 4 pm (not just when at beach or pool, but even with exercise, golf, tennis, and yard work!)  Use a sunscreen that says "broad spectrum" so it covers both UVA and UVB rays, and make sure to reapply every 1-2 hours.  Remember to change the batteries in your smoke detectors when changing your clock times in the spring and fall.  Use your seat belt every time you are in a car, and please drive safely and not be distracted with cell phones and texting while driving.  Continue the Nexium once daily, at least for the next 2-3 months.  If/when your allergies resolve, and you aren't having any significant reflux symptoms, you can try  decreasing the Nexium to every other day, and if no recurrent symptoms (either of shortness of breath/wheezing or of reflux/indigestion) then you can try tapering back to just as needed (taking prior to a meal that will likely trigger heartburn--ie spicy, acidic--pizza, tomato sauce). If you have recurrent heartburn or wheezing, then restart long-term.  Please call Dr. Collene Mares to schedule your colonoscopy. Schedule routine dental cleaning/evaluation.

## 2014-11-28 NOTE — Progress Notes (Signed)
Chief Complaint  Patient presents with  . Annual Exam    fasting annual exam no pap-sees Dr.Kaplan and is scheduled for next month. Did not do eye exam she had one a few wees ago(glasses are ready).Also follow up on SOB. Did not do UA as she is on cycle and not having any issues. No concerns today.   Amy Hill is a 49 y.o. female who presents for a complete physical.  She has the following concerns:  She presents for follow up on chest pain and shortness of breath.  She doesn't have the chest pain as often, and shortness of breath is also much less often. No related to exercise, stress, palpitations.  Symptoms have been very mild; inhaler has been helpful. Using inhaler 2x in the last week.  She was treated with a z-pak and albuterol after x-ray 3 weeks ago showed: FINDINGS: The lungs are mildly hyperinflated. On the lateral film there is subtle increased density that projects over the mid thoracic spine likely lies in the posterior inferior aspect of the upper lobe or superior segment of the lower lobe on the right. There is no pleural effusion. The heart and pulmonary vascularity are normal. The trachea is midline. The bony thorax exhibits no acute abnormality.  IMPRESSION: Reactive airway disease-COPD with superimposed subsegmental atelectasis or early infiltrate in the right mid lung posteriorly. Follow-up radiographs following anticipated antibiotic therapy are recommended to assure clearing.  Allergies--symptoms are improved.  Slight scratchy throat last night and this morning. Some sneezing.  Eye symptoms she had earlier in the season have resolved.  GERD:  She has been taking Nexium, and it is better. Certain foods can still trigger symptoms (heartburn). Still has some belching.   Immunization History  Administered Date(s) Administered  . Td 04/09/2002  . Tdap 01/21/2011  doesn't get flu shots Last Pap smear: through GYN, last year, UTD (appt scheduled for  12/11/14) Last mammogram: through GYN, done last year, UTD Last colonoscopy: 01/2009, Dr. Collene Mares; due again 01/2014 but didn't have.  Last DEXA: never Ophtho: went recently, and got glasses Dentist: about 2 years ago Exercise: walks 3x/week, 30 minutes or longer, also some exercise bike, leg lifts. Just started some upper body weights. Vitamin D level 1 year ago was 34 Lab Results  Component Value Date   CHOL 207* 11/01/2013   HDL 84 11/01/2013   LDLCALC 110* 11/01/2013   TRIG 64 11/01/2013   CHOLHDL 2.5 11/01/2013    Past Medical History  Diagnosis Date  . Contraceptive management   . Allergy   . Migraine   . Frequent UTI   . Adenomatous colon polyp 01/2009  . GERD (gastroesophageal reflux disease)     Past Surgical History  Procedure Laterality Date  . Cesarean section    . Colonoscopy  01/10/2009    tubular adenoma (Dr. Collene Mares); repeat every 5 years    History   Social History  . Marital Status: Married    Spouse Name: N/A  . Number of Children: 1  . Years of Education: N/A   Occupational History  . payroll/accounting    Social History Main Topics  . Smoking status: Never Smoker   . Smokeless tobacco: Never Used  . Alcohol Use: Yes     Comment: 1-2 drinks once a month  . Drug Use: No  . Sexual Activity:    Partners: Male    Birth Control/ Protection: Pill   Other Topics Concern  . Not on file   Social  History Narrative   Lives with husband (out of town a lot, Administrator) and 1 daughter.  Works doing accounting/payroll (high stress job)    Family History  Problem Relation Age of Onset  . Hyperlipidemia Mother   . Hypertension Mother   . Colon polyps Father   . Hyperlipidemia Father   . Hypertension Father   . Gout Father   . Heart disease Maternal Grandfather   . Cancer Neg Hx   . Diabetes Neg Hx     Outpatient Encounter Prescriptions as of 11/28/2014  Medication Sig Note  . albuterol (PROVENTIL HFA;VENTOLIN HFA) 108 (90 BASE) MCG/ACT inhaler  Inhale 2 puffs into the lungs every 6 (six) hours as needed for wheezing or shortness of breath. You can use 15 mins prior to exercise also 11/28/2014: Using as needed (a couple of times in the last week)  . Esomeprazole Magnesium (NEXIUM 24HR PO) Take 1 capsule by mouth daily.   . IRON PO Take 130 mg by mouth daily.   . Multiple Vitamins-Minerals (MULTI-VITAMIN GUMMIES PO) Take 1 each by mouth daily.     [name] Kitchen Spacer/Aero Chamber Mouthpiece MISC 1 each by Does not apply route as directed.   . TRI-SPRINTEC 0.18/0.215/0.25 MG-35 MCG TABS    . triamcinolone (NASACORT) 55 MCG/ACT AERO nasal inhaler Place 2 sprays into the nose daily.   . Ascorbic Acid (VITAMIN C PO) Take by mouth. 11/28/2014: Uses occasionally--takes vitamin C or drinks juice  . [DISCONTINUED] azithromycin (ZITHROMAX) 250 MG tablet Take 2 tablets by mouth on first day, then 1 tablet by mouth on days 2 through 5   . [DISCONTINUED] triamcinolone (NASACORT) 55 MCG/ACT AERO nasal inhaler Place 2 sprays into the nose daily. 10/23/2014: Uses sporadically, prn (not in the last 2-3 days)    No Known Allergies  ROS: The patient denies anorexia, fever, weight changes, headaches, vision changes (other than getting new glasses) decreased hearing, ear pain, sore throat (from PND/allergies today), breast concerns, palpitations (has some intermittently, unchanged, infrequent ,not associated with SOB), dizziness, syncope, cough, swelling, nausea, vomiting, diarrhea, constipation, abdominal pain, melena, hematochezia, hematuria, incontinence, dysuria, irregular menstrual cycles, vaginal discharge, odor or itch, genital lesions, joint pains, numbness, tingling, weakness, tremor, suspicious skin lesions, depression, anxiety (some, intermittent related to stressful job), abnormal bleeding/bruising, or enlarged lymph nodes. Allergies are controlled with medications. +chest pain, belching, dyspnea --overall improved; see HPI  PHYSICAL EXAM:  BP 130/80 mmHg   Pulse 80  Ht 4\' 11"  (1.499 m)  Wt 149 lb 3.2 oz (67.677 kg)  BMI 30.12 kg/m2  LMP 11/27/2014  General Appearance:  Alert, cooperative, no distress, appears stated age. Some throat-clearing noted during visit   Head:  Normocephalic, without obvious abnormality, atraumatic   Eyes:  PERRL, conjunctiva/corneas clear, EOM's intact, fundi  benign   Ears:  Normal TM's and external ear canals   Nose:  Nares normal, mucosa normal, only mild edema, no drainage or sinus tenderness   Throat:  Lips, mucosa, and tongue normal; teeth and gums normal   Neck:  Supple, no lymphadenopathy; thyroid: no enlargement/tenderness/nodules; no carotid  bruit or JVD   Back:  Spine nontender, no curvature, ROM normal, no CVA tenderness   Lungs:  Clear to auscultation bilaterally without wheezes, rales or ronchi; respirations unlabored   Chest Wall:  No tenderness or deformity   Heart:  Regular rate and rhythm, S1 and S2 normal, no murmur, rub  or gallop   Breast Exam:  Deferred to GYN   Abdomen:  Soft, non-tender, nondistended, normoactive bowel sounds,  no masses, no hepatosplenomegaly   Genitalia:  Deferred to GYN      Extremities:  No clubbing, cyanosis or edema   Pulses:  2+ and symmetric all extremities   Skin:  Skin color, texture, turgor normal, no rashes or lesions   Lymph nodes:  Cervical, supraclavicular, and axillary nodes normal   Neurologic:  CNII-XII intact, normal strength, sensation and gait; reflexes 2+ and symmetric throughout     Psych: Normal mood, affect, hygiene and grooming         Chemistry      Component Value Date/Time   NA 135 10/30/2014 0001   K 4.6 10/30/2014 0001   CL 100 10/30/2014 0001   CO2 28 10/30/2014 0001   BUN 10 10/30/2014 0001   CREATININE 0.72 10/30/2014 0001      Component Value Date/Time   CALCIUM 9.6 10/30/2014 0001   ALKPHOS 42 10/30/2014 0001   AST 24 10/30/2014 0001   ALT 21  10/30/2014 0001   BILITOT 0.5 10/30/2014 0001    Glucose 87  Lab Results  Component Value Date   WBC 5.7 10/30/2014   HGB 13.8 10/30/2014   HCT 41.0 10/30/2014   MCV 89.7 10/30/2014   PLT 252 10/30/2014   Lab Results  Component Value Date   TSH 1.192 10/30/2014    ASSESSMENT/PLAN:  Annual physical exam  Dyspnea - improved. s/p ABX. recheck CXR. DDD includes RAD, and reflux contribution - Plan: DG Chest 2 View  Allergic rhinitis, unspecified allergic rhinitis type  Gastroesophageal reflux disease without esophagitis - discussed Nexium use and how to try taper in future.  Discussed monthly self breast exams and yearly mammograms; at least 30 minutes of aerobic activity at least 5 days/week, weight-bearing exercise 2-3x/wk; proper sunscreen use reviewed; healthy diet, including goals of calcium and vitamin D intake and alcohol recommendations (less than or equal to 1 drink/day) reviewed; regular seatbelt use; changing batteries in smoke detectors. Immunization recommendations discussed. Flu shots recommended yearly. Colonoscopy was due in June 2015--to call Dr. Collene Mares to schedule  Dyspnea: Recheck CXR; continue albuterol prn.  Allergies:  Continue antihistamines prn, nasal steroids.  Continue the Nexium once daily, at least for the next 2-3 months.  If/when your allergies resolve, and you aren't having any significant reflux symptoms, you can try decreasing the Nexium to every other day, and if no recurrent symptoms (either of shortness of breath/wheezing or of reflux/indigestion) then you can try tapering back to just as needed (taking prior to a meal that will likely trigger heartburn--ie spicy, acidic--pizza, tomato sauce). If you have recurrent heartburn or wheezing, then restart long-term.

## 2014-12-02 ENCOUNTER — Ambulatory Visit
Admission: RE | Admit: 2014-12-02 | Discharge: 2014-12-02 | Disposition: A | Discharge status: Home / Self Care | Payer: BLUE CROSS/BLUE SHIELD | Source: Ambulatory Visit | Attending: Family Medicine | Admitting: Family Medicine

## 2014-12-02 DIAGNOSIS — R0600 Dyspnea, unspecified: Secondary | ICD-10-CM

## 2014-12-05 ENCOUNTER — Encounter: Payer: Self-pay | Admitting: Family Medicine

## 2014-12-30 ENCOUNTER — Telehealth: Payer: Self-pay | Admitting: Family Medicine

## 2014-12-30 NOTE — Telephone Encounter (Signed)
Left message for pt to call. She needs to move cpe appt with [facility] on 04.24.2017.

## 2015-01-02 ENCOUNTER — Telehealth: Payer: Self-pay | Admitting: Family Medicine

## 2015-01-02 NOTE — Telephone Encounter (Signed)
Left second message for pt to call. Need to change 12/01/2015 appt.

## 2015-01-15 ENCOUNTER — Encounter: Payer: Self-pay | Admitting: Family Medicine

## 2015-01-15 ENCOUNTER — Ambulatory Visit (INDEPENDENT_AMBULATORY_CARE_PROVIDER_SITE_OTHER): Payer: Self-pay | Admitting: Family Medicine

## 2015-01-15 VITALS — BP 118/70 | HR 96 | Wt 143.4 lb

## 2015-01-15 DIAGNOSIS — R109 Unspecified abdominal pain: Secondary | ICD-10-CM

## 2015-01-15 DIAGNOSIS — M545 Low back pain: Secondary | ICD-10-CM

## 2015-01-15 LAB — POCT URINALYSIS DIPSTICK
Bilirubin, UA: NEGATIVE
Blood, UA: NEGATIVE
Glucose, UA: NEGATIVE
LEUKOCYTES UA: NEGATIVE
Nitrite, UA: NEGATIVE
Protein, UA: NEGATIVE
Spec Grav, UA: >=1.03
UROBILINOGEN UA: NEGATIVE
pH, UA: 6

## 2015-01-15 NOTE — Progress Notes (Signed)
Chief Complaint  Patient presents with  . Back Pain    Duration 1 month, radiates around side some in pelvic, up back, diagnosed with 3 fibroids  . Back Pain    Bloating before cycle - no other complaints.   Patient presents with complaint of back pain x 1 month. Pain is sporadic, not daily, off and on for a few episodes an hour here and there.  Discomfort that she gets in her back lasts only 10 seconds, dull pain.  Sometimes is in the lower back, on the left, and sometimes radiates around to her left side/abdomen.  She had a known fibroid.  Had recent GYN exam. After developing some lower pelvic pain, she had an ultrasound, and found 3 fibroids.   She continues to have some pain across her lower abdomen/groin, described as a cramp; other times she will have some low back pain, mostly on the left, sometimes on the right.  Sometimes she had the pain located in the left side of her abdomen, mid-way, and sometimes pain will shoot up the back.  She had some bloating earlier today, resolved. Improved some after passing gas while in the shower, but didn't completely resolve until lunchtime.  Her bowels are normal.  Sometimes she feels like she doesn't completely empty, just occasionally. Denies any changes in her diet. She reports she is past due for colonoscopy, and hasn't scheduled it yet.  She is wondering if the pain is from fibroids or something else.  Denies dysuria, blood in urine or odor to the urine. She sometimes has some urinary frequency, but drinks a lot of water.    PMH, PSH, SH reviewed  Outpatient Encounter Prescriptions as of 01/15/2015  Medication Sig Note  . albuterol (PROVENTIL HFA;VENTOLIN HFA) 108 (90 BASE) MCG/ACT inhaler Inhale 2 puffs into the lungs every 6 (six) hours as needed for wheezing or shortness of breath. You can use 15 mins prior to exercise also 11/28/2014: Using as needed (a couple of times in the last week)  . Ascorbic Acid (VITAMIN C PO) Take by mouth. 11/28/2014:  Uses occasionally--takes vitamin C or drinks juice  . Esomeprazole Magnesium (NEXIUM 24HR PO) Take 1 capsule by mouth daily.   . IRON PO Take 130 mg by mouth daily.   . Multiple Vitamins-Minerals (MULTI-VITAMIN GUMMIES PO) Take 1 each by mouth daily.     [name] Kitchen Spacer/Aero Chamber Mouthpiece MISC 1 each by Does not apply route as directed.   . TRI-SPRINTEC 0.18/0.215/0.25 MG-35 MCG TABS    . triamcinolone (NASACORT) 55 MCG/ACT AERO nasal inhaler Place 2 sprays into the nose daily.    No facility-administered encounter medications on file as of 01/15/2015.   No Known Allergies  ROS: Denies fevers, chills, nausea, vomiting, diarrhea, URI symptoms, cough, shortness of breath or wheezing.  Chest pain had resolved--yesterday she had just a very slight pain while at work (overall much better). No headache, dizziness, bleeding/bruising, rash or other concerns except per HPI  PHYSICAL EXAM:  BP 118/70 mmHg  Pulse 96  Wt 143 lb 6.4 oz (65.046 kg) Well developed, pleasant female in no distress Chest wall: nontender Heart: regular rate and rhythm without murmur Lungs: clear bilaterally Back: no spinal tenderness, SI joint tenderness, CVA tenderness. No muscle spasm Abdomen: normal bowel sounds; nontender, no organomegaly or mass Extremities: no edema Neuro: alert and oriented.  Normal strength, gait Psych: normal mood, affect; slightly anxious (seems to focus a lot on minor discomfort)  Urine dip:  1+ ketones; no  blood or leuks  ASSESSMENT/PLAN:  Abdominal pain, unspecified abdominal location - Plan: Urinalysis Dipstick  Low back pain without sciatica, unspecified back pain laterality   Per pt, past due for colonoscopy--encouraged to schedule, for additional reassurance DDx discussed, suspect GI/gas, nothing worrisome. Discussed avoiding foods which can cause gas, trial of simethicone, and consider lactose-free trial for a week to see if symptoms improve.   There is nothing of concern on  your exam today, to suggest an underlying problem causing the back or abdominal pain.  I suspect there may be a component of gas and/or constipation at times.  I recommend drinking plenty of fluids, high fiber diet.  Pay close attention to the foods you eat to see if there is a trigger for the discomfort.  I would do a lactose-free trial for a week to see if that helps.  But the other foods that trigger gas pains are some raw vegetables, beans, etc.  Use Gas-X or simethicone if having frequent pain. If you are due for colonoscopy, schedule it, for additional reassurance that bowels are okay. (looks like you were due 01/2014 with Dr. Collene Mares).  There is no evidence of any bladder or kidney problems.  Return if increasing frequency, duration, severity of the pain for re-evaluation, and if any fever, vomiting, or other new concerns.  25 min visit, more than 1/2 spent counseling/reassuring.

## 2015-01-15 NOTE — Patient Instructions (Addendum)
There is nothing of concern on your exam today, to suggest an underlying problem causing the back or abdominal pain.  I suspect there may be a component of gas and/or constipation at times.  I recommend drinking plenty of fluids, high fiber diet.  Pay close attention to the foods you eat to see if there is a trigger for the discomfort.  I would do a lactose-free trial for a week to see if that helps.  But the other foods that trigger gas pains are some raw vegetables, beans, etc.  Use Gas-X or simethicone if having frequent pain. If you are due for colonoscopy, schedule it, for additional reassurance that bowels are okay. (looks like you were due 01/2014 with Dr. Collene Mares).  There is no evidence of any bladder or kidney problems.  Return if increasing frequency, duration, severity of the pain for re-evaluation, and if any fever, vomiting, or other new concerns.  Lactose-Free Diet Lactose is a carbohydrate that is found mainly in milk and milk products, as well as in foods with added milk or whey. Lactose must be digested by the enzyme lactase in order to be used by the body. Lactose intolerance occurs when there is a shortage of lactase. When your body is not able to digest lactose, you may feel sick to your stomach (nausea), bloated, and have cramps, gas, and diarrhea. TYPES OF LACTASE DEFICIENCY  Primary lactase deficiency. This is the most common type. It is characterized by a slow decrease in lactase activity.  Secondary lactase deficiency. This occurs following injury to the small intestinal mucosa as a result of a disease or condition. It can also occur as a result of surgery or after treatment with antibiotic medicines or cancer drugs. Tolerance to lactose varies widely. Each person must determine how much milk can be consumed without developing symptoms. Drinking smaller portions of milk throughout the day may be helpful. Some studies suggest that slowing gastric emptying may help increase  tolerance of milk products. This may be done by:  Consuming milk or milk products with a meal rather than alone.  Consuming milk with a higher fat content. There are many dairy products that may be tolerated better than milk by some people, including:  Cheese (especially aged cheese). The lactose content is much lower than in milk.  Cultured dairy products, such as yogurt, buttermilk, cottage cheese, and sweet acidophilus milk (kefir). These products are usually well tolerated by lactase-deficient people. This is because the healthy bacteria help digest lactose.  Lactose-hydrolyzed milk. This product contains 40% to 90% less lactose than milk and may also be well tolerated. ADEQUACY These diets may be deficient in calcium, riboflavin, and vitamin D, according to the Recommended Dietary Allowances of the [employer]. Depending on individual tolerances and the use of milk substitutes, milk, or other dairy products, you may be able to meet these recommendations. SPECIAL NOTES  Lactose is a carbohydrate. The main food source for lactose is dairy products. Reading food labels is important. Many products contain lactose even when they are not made from milk. Look for the following words: whey, milk solids, dry milk solids, nonfat dry milk powder. Typical sources of lactose other than dairy products include breads, candies, cold cuts, prepared and processed foods, and commercial sauces and gravies.  All foods must be prepared without milk, cream, or other dairy foods.  A vitamin or mineral supplement may be necessary. Consult your caregiver or Registered Dietitian.  Lactose is also found in many prescription  and over-the-counter medicines.  Soy milk and lactose-free supplements may be used as an alternative to milk. CHOOSING FOODS Breads and Starches  Allowed: Breads and rolls made without milk. [place], [place], or [place] bread. Soda crackers, graham crackers. Any crackers prepared  without lactose. Cooked or dry cereals prepared without lactose (read labels). Any potatoes, pasta, or rice prepared without milk or lactose. Popcorn.  Avoid: Breads and rolls that contain milk. Prepared mixes such as muffins, biscuits, waffles, pancakes. Sweet rolls, donuts, [place] toast (if made with milk or lactose). Zwieback crackers, corn curls, or any crackers that contain lactose. Cooked or dry cereals prepared with lactose (read labels). Instant potatoes, frozen [place] fries, scalloped or au gratin potatoes. Vegetables  Allowed: Fresh, frozen, and canned vegetables.  Avoid: Creamed or breaded vegetables. Vegetables in a cheese sauce or with lactose-containing margarines. Fruit  Allowed: All fresh, canned, or frozen fruits that are not processed with lactose.  Avoid: Any canned or frozen fruits processed with lactose. Meat and Meat Substitutes  Allowed: Plain beef, chicken, fish, [place], lamb, veal, pork, or ham. Kosher prepared meat products. Strained or junior meats that do not contain milk. Eggs, soy meat substitutes, nuts.  Avoid: Scrambled eggs, omelets, and souffles that contain milk. Creamed or breaded meat, fish, or fowl. Sausage products such as wieners, liver sausage, or cold cuts that contain milk solids. Cheese, cottage cheese, or cheese spreads. Milk  Allowed: None.  Avoid: Milk (whole, 2%, skim, or chocolate). Evaporated, powdered, or condensed milk. Malted milk. Soups and Combination Foods  Allowed: Bouillon, broth, vegetable soups, clear soups, consomms. Homemade soups made with allowed ingredients. Combination or prepared foods that do not contain milk or milk products (read labels).  Avoid: Cream soups, chowders, commercially prepared soups containing lactose. Macaroni and cheese, pizza. Combination or prepared foods that contain milk or milk products. Desserts and Sweets  Allowed: Water and fruit ices, gelatin, angel food cake. Homemade cookies, pies, or cakes  made from allowed ingredients. Pudding (if made with water or a milk substitute). Lactose-free tofu desserts. Sugar, honey, corn syrup, jam, jelly, marmalade, molasses (beet sugar). Pure sugar candy, marshmallows.  Avoid: Ice cream, ice milk, sherbet, custard, pudding, frozen yogurt. Commercial cake and cookie mixes. Desserts that contain chocolate. Pie crust made with milk-containing margarine. Reduced calorie desserts made with a sugar substitute that contains lactose. Toffee, peppermint, butterscotch, chocolate, caramels. Fats and Oils  Allowed: Butter (as tolerated, contains very small amounts of lactose). Margarines and dressings that do not contain milk. Vegetable oils, shortening, mayonnaise, nondairy cream and whipped toppings without lactose or milk solids added. Berniece Salines.  Avoid: Margarines and salad dressings containing milk. Cream, cream cheese, peanut butter with added milk solids, sour cream, chip dips made with sour cream. Beverages  Allowed: Carbonated drinks, tea, coffee and freeze-dried coffee, some instant coffees (check labels). Fruit drinks, fruit and vegetable juice, rice or soy milk.  Avoid: Hot chocolate. Some cocoas, some instant coffees, instant iced teas, powdered fruit drinks (read labels). Condiments  Allowed: Soy sauce, carob powder, olives, gravy made with water, baker's cocoa, pickles, pure seasonings and spices, wine, pure monosodium glutamate, catsup, mustard.  Avoid: Some chewing gums, chocolate, some cocoas. Certain antibiotics and vitamin or mineral preparations. Spice blends if they contain milk products. MSG extender. Artificial sweeteners that contain lactose. Some nondairy creamers (read labels). SAMPLE MENU Breakfast  Orange juice.  Banana.  Bran cereal.  Nondairy creamer.  [place] bread, toasted.  Butter or milk-free margarine.  Coffee or tea. Lunch  Chicken breast.  Rice.  Green beans.  Butter or milk-free margarine.  Fresh  melon.  Coffee or tea. Dinner  [employer].  Baked potato.  Butter or milk-free margarine.  Broccoli.  Lettuce salad with vinegar and oil dressing.  [employer]  Coffee or tea. Document Released: 01/15/2002 Document Revised: 10/18/2011 Document Reviewed: 10/26/2013 [facility] Patient Information 2015 [place], [place]. This information is not intended to replace advice given to you by your health care provider. Make sure you discuss any questions you have with your health care provider.

## 2015-06-16 ENCOUNTER — Ambulatory Visit (INDEPENDENT_AMBULATORY_CARE_PROVIDER_SITE_OTHER): Payer: 59 | Admitting: Family Medicine

## 2015-06-16 ENCOUNTER — Encounter: Payer: Self-pay | Admitting: Family Medicine

## 2015-06-16 VITALS — BP 130/78 | HR 92 | Temp 98.5°F | Ht 59.0 in | Wt 140.0 lb

## 2015-06-16 DIAGNOSIS — R05 Cough: Secondary | ICD-10-CM

## 2015-06-16 DIAGNOSIS — J069 Acute upper respiratory infection, unspecified: Secondary | ICD-10-CM

## 2015-06-16 DIAGNOSIS — J309 Allergic rhinitis, unspecified: Secondary | ICD-10-CM

## 2015-06-16 DIAGNOSIS — R059 Cough, unspecified: Secondary | ICD-10-CM

## 2015-06-16 NOTE — Patient Instructions (Signed)
  Drink plenty of fluids. Start taking Guaifenesin (ie Mucinex) to help loosen up the phlegm. Continue the Delsym syrup as needed (vs changing to Mucinex DM combination). Restart the nasal steroid spray (flonase or nasacort) as there appears to be some inflammation consistent with allergies, and the postnasal drainage may be contributing to your cough.  If you develop fever, or if phlegm gets thicker and more discolored, please call for an antibiotic.  If you are having shortness of breath, don't just call for prescription, but return for re-evaluation

## 2015-06-16 NOTE — Progress Notes (Signed)
Chief Complaint  Patient presents with  . Cough    productive cough x 2 weeks, mucus in yellow in color. No fevers.    Her daughter had a cough, and then patient started with a dry cough.  She then developed mucus and phlegm, about 7-10 days ago.  Denies any head congestion, sinus pain, runny nose, ear pain.  She has slight sore throat intermittently, possibly from drainage.  Her cough is worse during the day, not affecting her sleep at night.  Cough is productive of yellowish phlegm.  Denies any blood in the mucus.  She occasionally still has a slight dry cough.   No fever, chills.  Yesterday she noticed getting slightly winded.  Didn't use albuterol (but felt like she could have--for the first time with this illness).  She has been taking Delsym twice daily. Hasn't been using nasal steroid recently--usually uses in the Spring. Denies allergy symptoms currently.  PMH, PSH, SH reviewed. Outpatient Encounter Prescriptions as of 06/16/2015  Medication Sig Note  . Ascorbic Acid (VITAMIN C PO) Take by mouth. 11/28/2014: Uses occasionally--takes vitamin C or drinks juice  . Dextromethorphan Polistirex (DELSYM PO) Take 10 mLs by mouth as needed.   . IRON PO Take 130 mg by mouth daily.   . Multiple Vitamins-Minerals (MULTI-VITAMIN GUMMIES PO) Take 1 each by mouth daily.     . TRI-SPRINTEC 0.18/0.215/0.25 MG-35 MCG TABS    . albuterol (PROVENTIL HFA;VENTOLIN HFA) 108 (90 BASE) MCG/ACT inhaler Inhale 2 puffs into the lungs every 6 (six) hours as needed for wheezing or shortness of breath. You can use 15 mins prior to exercise also (Patient not taking: Reported on 06/16/2015) 11/28/2014: Using as needed (a couple of times in the last week)  . Esomeprazole Magnesium (NEXIUM 24HR PO) Take 1 capsule by mouth daily. 06/16/2015: Uses prn  . Spacer/Aero Chamber Mouthpiece MISC 1 each by Does not apply route as directed. (Patient not taking: Reported on 06/16/2015)   . triamcinolone (NASACORT) 55 MCG/ACT AERO nasal  inhaler Place 2 sprays into the nose daily. 06/16/2015: Uses prn allergies, usually in the Spring   No facility-administered encounter medications on file as of 06/16/2015.   No Known Allergies  ROS: no fever, chills, nausea, vomiting, diarrhea ,bleeding, bruising, rash, shortness of breath, chest pain, ear pain, headaches, dizziness or any other complaints  PHYSICAL EXAM: BP 130/78 mmHg  Pulse 92  Temp(Src) 98.5 F (36.9 C) (Tympanic)  Ht 4\' 11"  (1.499 m)  Wt 140 lb (63.504 kg)  BMI 28.26 kg/m2  LMP 06/12/2015   Well developed, well-appearing female in no distress. occasional throat clearing and cough HEENT: PERRL, EOMI, conjunctiva clear.  TMs and EAC's normal. Nasal mucosa is moderately edematous, pale. No purulence. Sinuses are nontender. OP is clear Neck: no lymphadenopathy, thyromegaly or mass Heart: regular rate and rhythm without murmur Lungs: clear bilaterally. No wheezes, rales, ronchi Skin: normal turgor, no rash Neuro: alert and oriented. Cranial nerves intact. Normal strength, gait Psych: normal mood, affect, hygiene, grooming   ASSESSMENT/PLAN  Cough - virus (from daughter) plus possible PND from allergies. no evidence of bacterial infection  Acute upper respiratory infection  Allergic rhinitis, unspecified allergic rhinitis type    Drink plenty of fluids. Start taking Guaifenesin (ie Mucinex) to help loosen up the phlegm. Continue the Delsym syrup as needed (vs changing to Mucinex DM combination). Restart the nasal steroid spray (flonase or nasacort) as there appears to be some inflammation consistent with allergies, and the postnasal drainage may be  contributing to your cough.  If you develop fever, or if phlegm gets thicker and more discolored, please call for an antibiotic.  If you are having shortness of breath, don't just call for prescription, but return for re-evaluation

## 2015-06-23 ENCOUNTER — Ambulatory Visit (INDEPENDENT_AMBULATORY_CARE_PROVIDER_SITE_OTHER): Payer: 59 | Admitting: Family Medicine

## 2015-06-23 ENCOUNTER — Encounter: Payer: Self-pay | Admitting: Family Medicine

## 2015-06-23 VITALS — BP 138/80 | HR 80 | Temp 98.2°F | Ht 59.0 in | Wt 142.0 lb

## 2015-06-23 DIAGNOSIS — J209 Acute bronchitis, unspecified: Secondary | ICD-10-CM | POA: Diagnosis not present

## 2015-06-23 MED ORDER — AZITHROMYCIN 250 MG PO TABS: ORAL_TABLET | ORAL | Status: DC

## 2015-06-23 NOTE — Progress Notes (Signed)
Chief Complaint  Patient presents with  . Cough    mucus has changed from yellow to a brownish color and she is having some SOB, intermittently.    She was seen last week with complaint of cough x 2 weeks. It was felt that she likely had a virus from her daughter, as well as PND from allergies.  Conservative measures were encouraged (nasal steroid, mucinex, fluids).  She started having some shortness of breath over the last week, usually at the end of the day after getting home from work (when moving around more). She had some shortness of breath with exertion--ie stairs, laundry.  She used albuterol with some relief. Needing albuterol twice daily over the last few days.    Phlegm changed from yellow to brownish.  Denies any blood in her mucus.  She has been using the Nasacort, and denies any nasal congestion, bleeding, sinus pain/pressure. Denies any fevers.  PMH, PSH, SH reviewed  Outpatient Encounter Prescriptions as of 06/23/2015  Medication Sig Note  . albuterol (PROVENTIL HFA;VENTOLIN HFA) 108 (90 BASE) MCG/ACT inhaler Inhale 2 puffs into the lungs every 6 (six) hours as needed for wheezing or shortness of breath. You can use 15 mins prior to exercise also 06/23/2015: Using twice daily over the last few days  . Ascorbic Acid (VITAMIN C PO) Take by mouth. 11/28/2014: Uses occasionally--takes vitamin C or drinks juice  . Esomeprazole Magnesium (NEXIUM 24HR PO) Take 1 capsule by mouth daily. 06/16/2015: Uses prn  . IRON PO Take 130 mg by mouth daily.   . Multiple Vitamins-Minerals (MULTI-VITAMIN GUMMIES PO) Take 1 each by mouth daily.     [name] Kitchen Phenylephrine-DM-GG-APAP (MUCINEX FAST-MAX PO) Take 20 mLs by mouth as needed.   [name] Kitchen Spacer/Aero Chamber Mouthpiece MISC 1 each by Does not apply route as directed.   . TRI-SPRINTEC 0.18/0.215/0.25 MG-35 MCG TABS    . triamcinolone (NASACORT) 55 MCG/ACT AERO nasal inhaler Place 2 sprays into the nose daily. 06/16/2015: Uses prn allergies, usually in the  Spring  . [DISCONTINUED] Dextromethorphan Polistirex (DELSYM PO) Take 10 mLs by mouth as needed.    No facility-administered encounter medications on file as of 06/23/2015.    No Known Allergies  ROS: no fever, chills, headaches, dizziness, numbness, tingling, nausea, vomiting, bleeding, bruising, rash, chest pain, palpitations, edema or other concerns.  PHYSICAL EXAM: BP 138/80 mmHg  Pulse 80  Temp(Src) 98.2 F (36.8 C) (Tympanic)  Ht 4\' 11"  (1.499 m)  Wt 142 lb (64.411 kg)  BMI 28.67 kg/m2  SpO2 99%  LMP 06/12/2015  Well appearing female in no distress.  Frequent throat clearing. HEENT: PERRL, EOMI, conjunctiva clear.  TMs and EAC's normal. Nasal mucosa is mildly edematous and pale, no drainage noted. Sinuses nontender OP is clear, no erythema Heart: regular rate and rhythm without murmur Lungs: slightly coarse. Good air movement. No wheezes, rales, ronchi Skin: normal turgor, no rash Psych: normal mood, affect, hygiene and grooming Neuro: alert and oriented. crnaial nerves intact  ASSESSMENT/PLAN:  Acute bronchitis, unspecified organism - treat with z-pak, albuterol prn. continue mucinex and other supportive measures. - Plan: azithromycin (ZITHROMAX) 250 MG tablet   Bronchitis, now with some RAD. Start z-pak; continue albuterol as needed. Continue mucinex. Allergies seem to be under better control--continue current treatments.

## 2015-06-23 NOTE — Patient Instructions (Signed)
Continue allergy medications. Continue mucinex. Continue albuterol inhaler as needed. Take the z-pak as directed. Call in 5-7 days if no better or worse. Otherwise call in 10 days if not 100% better (not counting for allergies, but if ongoing significant cough and discolored phlegm).

## 2015-07-30 ENCOUNTER — Other Ambulatory Visit: Payer: Self-pay | Admitting: Obstetrics & Gynecology

## 2015-10-13 ENCOUNTER — Encounter: Payer: Self-pay | Admitting: Family Medicine

## 2015-10-13 ENCOUNTER — Ambulatory Visit (INDEPENDENT_AMBULATORY_CARE_PROVIDER_SITE_OTHER): Payer: BLUE CROSS/BLUE SHIELD | Admitting: Family Medicine

## 2015-10-13 VITALS — BP 142/80 | HR 100 | Ht 59.0 in | Wt 146.0 lb

## 2015-10-13 DIAGNOSIS — J309 Allergic rhinitis, unspecified: Secondary | ICD-10-CM | POA: Diagnosis not present

## 2015-10-13 DIAGNOSIS — R079 Chest pain, unspecified: Secondary | ICD-10-CM | POA: Diagnosis not present

## 2015-10-13 DIAGNOSIS — K219 Gastro-esophageal reflux disease without esophagitis: Secondary | ICD-10-CM

## 2015-10-13 DIAGNOSIS — R0789 Other chest pain: Secondary | ICD-10-CM

## 2015-10-13 MED ORDER — TRIAMCINOLONE ACETONIDE 55 MCG/ACT NA AERO: 2.0000 | INHALATION_SPRAY | Freq: Every day | NASAL | Status: DC

## 2015-10-13 NOTE — Patient Instructions (Addendum)
You had an area on the right chest wall that was tender to touch.  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 You can apply moist heat to the area as needed for pain, as well as using Aleve. The Aleve might also help with the neck pain/headaches and tingling.  Take Aleve 1-2 tablets twice daily with food (use 1 if it works,2 if it doesn't, as long as it doesn't bother your stomach, and restart the Nexium daily to protect the stomach if needed with the higher dose of Aleve). Take this regularly for up to 10 days, until the pain has completely resolved. (don't just use it intermittently when pain gets bad, but twice daily regularly until completely resolved).  It is possibly you have some muscle and nerve inflammation in your neck.  The aleve should help with that also. Don't sleep on 3 pillows--use just one; consider a neck roll, or contoured pillow to off more support to your neck while sleeping.  Use the Nasacort daily; you may use claritin or zyrtec or allegra along with it, if needed (while waiting for the spray to have its full effect).  These oral antihistamines can be used just if/when needed, but the steroid spray should be used daily, regularly, during the allergy season.  Costochondritis Costochondritis, sometimes called Tietze syndrome, is a swelling and irritation (inflammation) of the tissue (cartilage) that connects your ribs with your breastbone (sternum). It causes pain in the chest and rib area. Costochondritis usually goes away on its own over time. It can take up to 6 weeks or longer to get better, especially if you are unable to limit your activities. CAUSES  Some cases of costochondritis have no known cause. Possible causes include:  Injury (trauma).  Exercise or activity such as lifting.  Severe coughing. SIGNS AND SYMPTOMS  Pain and tenderness in the chest and rib  area.  Pain that gets worse when coughing or taking deep breaths.  Pain that gets worse with specific movements. DIAGNOSIS  Your health care provider will do a physical exam and ask about your symptoms. Chest X-rays or other tests may be done to rule out other problems. TREATMENT  Costochondritis usually goes away on its own over time. Your health care provider may prescribe medicine to help relieve pain. HOME CARE INSTRUCTIONS   Avoid exhausting physical activity. Try not to strain your ribs during normal activity. This would include any activities using chest, abdominal, and side muscles, especially if heavy weights are used.  Apply ice to the affected area for the first 2 days after the pain begins.  Put ice in a plastic bag.  Place a towel between your skin and the bag.  Leave the ice on for 20 minutes, 2-3 times a day.  Only take over-the-counter or prescription medicines as directed by your health care provider. SEEK MEDICAL CARE IF:  You have redness or swelling at the rib joints. These are signs of infection.  Your pain does not go away despite rest or medicine. SEEK IMMEDIATE MEDICAL CARE IF:   Your pain increases or you are very uncomfortable.  You have shortness of breath or difficulty breathing.  You cough up blood.  You have worse chest pains, sweating, or vomiting.  You have a fever or persistent symptoms for more than 2-3 days.  You have a fever and your symptoms suddenly get worse. MAKE SURE YOU:  Understand these instructions.  Will watch your condition.  Will get help right away if you are not doing well or get worse.   This information is not intended to replace advice given to you by your health care provider. Make sure you discuss any questions you have with your health care provider.   Document Released: 05/05/2005 Document Revised: 05/16/2013 Document Reviewed: 02/27/2013 Elsevier Interactive Patient Education [employer].

## 2015-10-13 NOTE — Progress Notes (Signed)
Chief Complaint  Patient presents with  . Chest Pain    started last Tuesday, and right hand tingling started Saturday morning. Not tingling today. All day Saturday and some of Sunday. Chest pain off and on since last Tuesday, mainly on the right side but when in the middle of her chest it feels tight.   . Medication Refill    Nasacort.   Six days ago she developed chest pain while at work.  Pain was across the whole chest, more on the right than the left, but also in the center and slightly on the left.  Occurred while at rest, sitting at her desk.  No particular stressor, not after eating.  No tachycardia or palpitations when the chest pain started.  No shortness of breath, or pain with breathing.  7/10 at its worst, fluctuates, but has been persistent. Pain has been coming and going since it started on Tuesday.  Pain has never completely gone away, but pain decreases significantly.  She took Aleve yesterday, no other pain medications.  2 mornings ago she woke up with tingling in her right hand. It lasted all day Saturday, part of the day yesterday, none today.  Believes it was al 5 fingers, no numbness into the arms. No hand/arm weakness noted, but 2 days ago had some generalized weakness--felt better after eating. She has had some headaches, mostly when she wakes up in the morning, mostly posterior. She sleeps with 3 pillows. Often resolves on its own, other times takes Aleve.  Denies any recent migraines.    Allergies have been flaring some.  Slight cough, mostly throat-clearing.  She had lifted a laundry basket of clothing 5 days ago (one day following onset of chest pain), and lifted a 32 pack of water on Friday night.  Tingling in hand started the next day. No other known injury or change in activity.  2 weeks ago she noted she was burping more.  She started taking Nexium x 6-7 days, which made the belching resolve.  This was prior to the onset of chest pain. She thinks she took a Nexium  on the Tuesday that her chest pain started. Didn't seem to help, so didn't continue taking it. No ongoing belching, heartburn, nausea, vomiting.  She had a similar problem last year, prior to trip to [place].  This time it was just prior to (and during) a getaway to [place].   Last CXR was 11/2014, normal.  Last lipids: Lab Results  Component Value Date   CHOL 207* 11/01/2013   HDL 84 11/01/2013   LDLCALC 110* 11/01/2013   TRIG 64 11/01/2013   CHOLHDL 2.5 11/01/2013   PMH, PSH, SH reviewed. Family history reviewed/updated  Outpatient Encounter Prescriptions as of 10/13/2015  Medication Sig Note  . IRON PO Take 130 mg by mouth daily. Reported on 10/13/2015   . Multiple Vitamins-Minerals (MULTI-VITAMIN GUMMIES PO) Take 1 each by mouth daily.     . TRI-SPRINTEC 0.18/0.215/0.25 MG-35 MCG TABS    . triamcinolone (NASACORT) 55 MCG/ACT AERO nasal inhaler Place 2 sprays into the nose daily.   . [DISCONTINUED] triamcinolone (NASACORT) 55 MCG/ACT AERO nasal inhaler Place 2 sprays into the nose daily. 10/13/2015: She has been using it sporadically, not in the last week (but did take it the week prior). Needs refill  . albuterol (PROVENTIL HFA;VENTOLIN HFA) 108 (90 BASE) MCG/ACT inhaler Inhale 2 puffs into the lungs every 6 (six) hours as needed for wheezing or shortness of breath. You can use 15  mins prior to exercise also (Patient not taking: Reported on 10/13/2015) 10/13/2015: Hasn't needed in about 6 months  . Ascorbic Acid (VITAMIN C PO) Take by mouth. Reported on 10/13/2015 11/28/2014: Uses occasionally--takes vitamin C or drinks juice  . Esomeprazole Magnesium (NEXIUM 24HR PO) Take 1 capsule by mouth daily. Reported on 10/13/2015 10/13/2015: Took for about 6 days, last taken 6 days ago  . [DISCONTINUED] azithromycin (ZITHROMAX) 250 MG tablet Take 2 tablets by mouth on first day, then 1 tablet by mouth on days 2 through 5   . [DISCONTINUED] Phenylephrine-DM-GG-APAP (MUCINEX FAST-MAX PO) Take 20 mLs by mouth  as needed.   . [DISCONTINUED] Spacer/Aero Chamber Mouthpiece MISC 1 each by Does not apply route as directed.    No facility-administered encounter medications on file as of 10/13/2015.   No Known Allergies  ROS: no fever, chills, sinus pain, sore throat. +mild congestion, PND, throat clearing, rare cough. No shortness of breath.  No bleeding, bruising, rash. No nausea, vomiting, bowel changes--GI symptoms resolved with nexium last week.  +posterior headaches as per HPI. +stressful job; denies depression or significant anxiety.  PHYSICAL EXAM: BP 142/80 mmHg  Pulse 100  Ht 4\' 11"  (1.499 m)  Wt 146 lb (66.225 kg)  BMI 29.47 kg/m2  LMP 10/02/2015  Pulse 88 on repeat by MD Well-appearing, well developed, pleasant female in no distress Currently denies any discomfort (last had discomfort while at work about an hour ago). HEENT: PERRL EOMI, conjunctiva and sclera are clear.  Nasal mucosa is mild-mod edematous, no erythema or purulence. Sinuses are nontender Neck: no lymphadenopathy, thyromegaly or carotid bruit. No c-spine tenderness, no muscle spasm Heart: regular rate and rhythm without murmur Lungs: clear bilaterally. No wheezes rales, ronchi Chest wall: Reproducible tenderness at one level of costochondral junction on the right, at mid-breast level.  nontender elsewhere Abdomen: soft, nontender, no mass or organomegaly Extremities: no edema, 2+ pulses.  Negative Tinel and Phalen Neuro: alert and oriented. Cranial nerves intact. Normal strength, gait Psych: normal mood, affect, hygiene and grooming  EKG: NSR, rate 81.  Completely unchanged from EKG 10/23/14.  Mild LAE, slight PR depression V4-6.   ASSESSMENT/PLAN:  Chest wall pain - suspect costochondritis; NSAIDs, supportive measures  Chest pain, unspecified chest pain type - Plan: EKG 12-Lead  Gastroesophageal reflux disease without esophagitis - recent fkae, resolved with OTC Nexium  Allergic rhinitis, unspecified allergic  rhinitis type - encouraged regular/daily use of inhaled steroid - Plan: triamcinolone (NASACORT) 55 MCG/ACT AERO nasal inhaler   You had an area on the right chest wall that was tender to touch.  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 You can apply moist heat to the area as needed for pain, as well as using Aleve. The Aleve might also help with the neck pain/headaches and tingling.  Take Aleve 1-2 tablets twice daily with food (use 1 if it works,2 if it doesn't, as long as it doesn't bother your stomach, and restart the Nexium daily to protect the stomach if needed with the higher dose of Aleve). Take this regularly for up to 10 days, until the pain has completely resolved. (don't just use it intermittently when pain gets bad, but twice daily regularly until completely resolved).  It is possibly you have some muscle and nerve inflammation in your neck.  The aleve should help with that also. Don't sleep on 3 pillows--use just one; consider  a neck roll, or contoured pillow to off more support to your neck while sleeping.  Use the Nasacort daily; you may use claritin or zyrtec or allegra along with it, if needed (while waiting for the spray to have its full effect).  These oral antihistamines can be used just if/when needed, but the steroid spray should be used daily, regularly, during the allergy season.

## 2015-11-05 ENCOUNTER — Ambulatory Visit (INDEPENDENT_AMBULATORY_CARE_PROVIDER_SITE_OTHER): Payer: BLUE CROSS/BLUE SHIELD | Admitting: Family Medicine

## 2015-11-05 ENCOUNTER — Encounter: Payer: Self-pay | Admitting: Family Medicine

## 2015-11-05 VITALS — BP 132/72 | HR 100 | Ht 59.0 in | Wt 145.0 lb

## 2015-11-05 DIAGNOSIS — R0600 Dyspnea, unspecified: Secondary | ICD-10-CM | POA: Diagnosis not present

## 2015-11-05 NOTE — Patient Instructions (Signed)
Shortness of breath--seems to occur the same time each year. Usually before spring break, when you are traveling.  We need to consider whether there could be a component of allergies/reactive airways/chest congestion versus anxiety.  There appears to be SOME component of anxiety, as your blood pressure and pulse were quite high initially, but improved later in the visit.  Work on stress reducing techniques as we discussed.  Take Mucinex (guaifenesin) regularly for the next few days to see if that helps with the shortness of breath. Continue your allergy medications daily. If you aren't noticing any improvement in 1-2 days, go to [facility] ([address] for chest x-ray.  The order is in the computer, and we will be in touch through MyChart with the results.

## 2015-11-05 NOTE — Progress Notes (Signed)
Chief Complaint  Patient presents with  . Shortness of Breath    and chest pains. Chest pain not as bad as it was earlier in the month. More the SOB at times. SOB is def more on exertion, but at times when just sitting.    Shortness of breath recurred about a week ago, when going up and down the stairs a lot on the weekend.   She has some slight allergies, not really flaring much.  She sometimes does note some chest congestion, coughs up some phlegm and feels better--this feeling of congestion is different than the chest pain she is now describing. She has been using Nasacort daily.  The chest pain isn't as bad as the pain was when she was seen earlier this month. She had reproducible chest pain on exam, and she continues on Aleve and Nexium.  Pain is no longer constant, like it was at last visit, improved.    She is concerned that something was found on CXR in the past. Treated with ABX and dyspnea/pain improved. Chart reviewed--Normal CXR 09/2013, 10/2014 showed infiltrate (and hyperinflation) which cleared on f/u in 11/2014.  PFt's 09/2013 (?find interpretation)  On chart review, it appears that she has had similar complaints between Feb-Mar for the last 3 years.  Last year was also prior to her trip to [place].  She is intending to go to [place] on Spring Break again in a few weeks.  PMH, PSH, SH reviewed  Outpatient Encounter Prescriptions as of 11/05/2015  Medication Sig Note  . albuterol (PROVENTIL HFA;VENTOLIN HFA) 108 (90 BASE) MCG/ACT inhaler Inhale 2 puffs into the lungs every 6 (six) hours as needed for wheezing or shortness of breath. You can use 15 mins prior to exercise also 10/13/2015: Hasn't needed in about 6 months  . Ascorbic Acid (VITAMIN C PO) Take by mouth. Reported on 10/13/2015 11/28/2014: Uses occasionally--takes vitamin C or drinks juice  . Esomeprazole Magnesium (NEXIUM 24HR PO) Take 1 capsule by mouth daily. Reported on 10/13/2015 10/13/2015: Took for about 6 days, last taken 6 days  ago  . IRON PO Take 130 mg by mouth daily. Reported on 10/13/2015   . Multiple Vitamins-Minerals (MULTI-VITAMIN GUMMIES PO) Take 1 each by mouth daily.     . TRI-SPRINTEC 0.18/0.215/0.25 MG-35 MCG TABS    . triamcinolone (NASACORT) 55 MCG/ACT AERO nasal inhaler Place 2 sprays into the nose daily.    No facility-administered encounter medications on file as of 11/05/2015.   No Known Allergies  ROS:  Denies significant cough (just rare, as above). No fever, chills, nausea, vomiting, diarrhea. No bleeding, bruising rash.  Admits some possible anxiety--gets anxious when she feels badly, without really understanding the underlying cause. No urinary complaints, or other concerns.  PHYSICAL EXAM: BP 142/80 mmHg  Pulse 100  Ht 4\' 11"  (1.499 m)  Wt 145 lb (65.772 kg)  BMI 29.27 kg/m2  SpO2 99%  LMP 10/30/2015 BP high on initial presentation, improved on recheck by nurse, and further improved when rechecked by MD after long discussion/reassurance. 132/72, right arm, Pulse 84 on repeat  Throat-clearing intermittently during visit, no cough Well appearing, mildly anxious female in no distress HEENT: PERRL, EOMI, conjunctiva and sclera are clear. Sinuses nontender, OP is clear Neck: no lymphadenopathy, thyromegaly or mass Heart: regular rate and rhythm without murmur Lungs: clear bilaterally Chest: nontender (minimal on right per pt, not really in a focal area) Abdomen: soft, nontender, no organomegaly or mass. Extremities: no edema Psych: mildly anxious, normal affect,  hygiene and grooming Neuro: alert and oriented, cranial nerves intact, normal gait, strength  Spirometry--normal  ASSESSMENT/PLAN:  Dyspnea - Plan: Spirometry with Graph, DG Chest 2 View  Dyspnea--seems to occur the same time each year. Usually before spring break.  We need to consider whether there could be a component of allergies/reactive airways/chest congestion versus anxiety.  Check CXR, since spirometry okay, if no  improvement noted after starting regular mucinex (wants to do by Friday since insurance will be changing in April). Continue regular allergy meds.  Consider anxiety.  Discussed prn meds vs preventative.  May need a trial of alprazolam prn if CXR normal and symptoms persist despite adequately treating for allergies and reflux  30 min visit, more than 1/2 spent counseling.

## 2015-11-07 ENCOUNTER — Ambulatory Visit
Admission: RE | Admit: 2015-11-07 | Discharge: 2015-11-07 | Disposition: A | Discharge status: Home / Self Care | Payer: BLUE CROSS/BLUE SHIELD | Source: Ambulatory Visit | Attending: Family Medicine | Admitting: Family Medicine

## 2015-11-07 DIAGNOSIS — R0600 Dyspnea, unspecified: Secondary | ICD-10-CM

## 2015-12-01 ENCOUNTER — Encounter: Payer: BLUE CROSS/BLUE SHIELD | Admitting: Family Medicine

## 2015-12-18 LAB — HM PAP SMEAR

## 2015-12-18 LAB — HM MAMMOGRAPHY: HM Mammogram: NORMAL (ref 0–4)

## 2015-12-22 ENCOUNTER — Encounter: Payer: Self-pay | Admitting: *Deleted

## 2015-12-24 ENCOUNTER — Ambulatory Visit (INDEPENDENT_AMBULATORY_CARE_PROVIDER_SITE_OTHER): Payer: Managed Care, Other (non HMO) | Admitting: Family Medicine

## 2015-12-24 ENCOUNTER — Encounter: Payer: Self-pay | Admitting: Family Medicine

## 2015-12-24 VITALS — BP 130/70 | HR 88 | Ht 59.0 in | Wt 143.8 lb

## 2015-12-24 DIAGNOSIS — R202 Paresthesia of skin: Secondary | ICD-10-CM | POA: Diagnosis not present

## 2015-12-24 DIAGNOSIS — R5383 Other fatigue: Secondary | ICD-10-CM | POA: Diagnosis not present

## 2015-12-24 DIAGNOSIS — Z0000 Encounter for general adult medical examination without abnormal findings: Secondary | ICD-10-CM | POA: Diagnosis not present

## 2015-12-24 DIAGNOSIS — K219 Gastro-esophageal reflux disease without esophagitis: Secondary | ICD-10-CM

## 2015-12-24 DIAGNOSIS — J309 Allergic rhinitis, unspecified: Secondary | ICD-10-CM

## 2015-12-24 LAB — COMPREHENSIVE METABOLIC PANEL
ALT: 16 U/L (ref 6–29)
AST: 23 U/L (ref 10–35)
Albumin: 3.7 g/dL (ref 3.6–5.1)
Alkaline Phosphatase: 38 U/L (ref 33–115)
BILIRUBIN TOTAL: 0.3 mg/dL (ref 0.2–1.2)
BUN: 8 mg/dL (ref 7–25)
CALCIUM: 8.9 mg/dL (ref 8.6–10.2)
CHLORIDE: 103 mmol/L (ref 98–110)
CO2: 25 mmol/L (ref 20–31)
Creat: 0.79 mg/dL (ref 0.50–1.10)
GLUCOSE: 81 mg/dL (ref 65–99)
Potassium: 4.2 mmol/L (ref 3.5–5.3)
SODIUM: 136 mmol/L (ref 135–146)
Total Protein: 8.3 g/dL — ABNORMAL HIGH (ref 6.1–8.1)

## 2015-12-24 LAB — POCT URINALYSIS DIPSTICK
Bilirubin, UA: NEGATIVE
Glucose, UA: NEGATIVE
Ketones, UA: NEGATIVE
Leukocytes, UA: NEGATIVE
Nitrite, UA: NEGATIVE
Protein, UA: NEGATIVE
RBC UA: NEGATIVE
Spec Grav, UA: 1.02
UROBILINOGEN UA: NEGATIVE
pH, UA: 6

## 2015-12-24 NOTE — Patient Instructions (Addendum)
  HEALTH MAINTENANCE RECOMMENDATIONS:  It is recommended that you get at least 30 minutes of aerobic exercise at least 5 days/week (for weight loss, you may need as much as 60-90 minutes). This can be any activity that gets your heart rate up. This can be divided in 10-15 minute intervals if needed, but try and build up your endurance at least once a week.  Weight bearing exercise is also recommended twice weekly.  Eat a healthy diet with lots of vegetables, fruits and fiber.  "Colorful" foods have a lot of vitamins (ie green vegetables, tomatoes, red peppers, etc).  Limit sweet tea, regular sodas and alcoholic beverages, all of which has a lot of calories and sugar.  Up to 1 alcoholic drink daily may be beneficial for women (unless trying to lose weight, watch sugars).  Drink a lot of water.  Calcium recommendations are 1200-1500 mg daily (1500 mg for postmenopausal women or women without ovaries), and vitamin D 1000 IU daily.  This should be obtained from diet and/or supplements (vitamins), and calcium should not be taken all at once, but in divided doses.  Monthly self breast exams and yearly mammograms for women over the age of 20 is recommended.  Sunscreen of at least SPF 30 should be used on all sun-exposed parts of the skin when outside between the hours of 10 am and 4 pm (not just when at beach or pool, but even with exercise, golf, tennis, and yard work!)  Use a sunscreen that says "broad spectrum" so it covers both UVA and UVB rays, and make sure to reapply every 1-2 hours.  Remember to change the batteries in your smoke detectors when changing your clock times in the spring and fall.  Use your seat belt every time you are in a car, and please drive safely and not be distracted with cell phones and texting while driving.   Try and sleep at least 7-8 hours/night. Be sure to stretch regularly, and work on core strengthening (yoga would be good)  Please call Dr. Lorie Apley office to arrange  for colonoscopy (past due for recheck for polyps).

## 2015-12-24 NOTE — Progress Notes (Signed)
Chief Complaint  Patient presents with  . Annual Exam    fasting annual exam, no pap-sees Dr.Kaplan and is UTD. Did not do eye exam as she recently had one.([facility], Dr. Marylynn Pearson) States that some days she just had no energy. Cycles are heavier now, she does have a fibroid (discussed with Deatra Ina last week). Still has some LBP and side pain. Still having tingling in your hands maybe once a week. When she turns her head to the right she has some neck pain.    Amy Hill is a 50 y.o. female who presents for a complete physical.  She has the following concerns:  Tingling in hands--she has been paying attention and it is mainly the first 3 fingers on the right hand.  Symptoms occur about once a week, not daily.  She complains of some neck pain on the right, into the shoulder.  She has some discomfort lower in the back when she turns her head to the right.   No numbess/tingling related to head position change or neck pain (just once a week in hands as above).  Shortness of breath and chest pain (see March visits)--overall much better.  Notices slight shortness of breath once every 2 weeks, usually with exertion/exercise, and if bad she uses albuterol inhaler with a good response.  Chest pain is mainly when she is belching, and taking the Nexium takes it away. She uses it prn only (after developing symptoms)  Allergies--symptoms are improved from how they were earlier in the season.  GERD: She is not currently taking any medication.  Uses Nexium just as needed, usually waiting for symptoms, rather than taking prior to eating foods which may trigger (ie pizza--she reports sometimes she can eat it without symptoms, which is why she doesn't take the med prior).  Immunization History  Administered Date(s) Administered  . Td 04/09/2002  . Tdap 01/21/2011   doesn't get flu shots Last Pap smear: through GYN, 2016 (had GYN visit last week, no pap done) Last mammogram: through GYN,  done las tweek Last colonoscopy: 01/2009, Dr. Collene Mares; due again 01/2014 but didn't have.  Last DEXA: never Ophtho: yearly Dentist: about 3 years ago Exercise: walks 3x/week, 30 minutes or longer, also some exercise bike, upper body weights 3x/wk. Vitamin D level: 34 in 10/2013  Lab Results  Component Value Date   CHOL 207* 11/01/2013   HDL 84 11/01/2013   LDLCALC 110* 11/01/2013   TRIG 64 11/01/2013   CHOLHDL 2.5 11/01/2013   Past Medical History  Diagnosis Date  . Contraceptive management   . Allergy   . Migraine   . Frequent UTI   . Adenomatous colon polyp 01/2009  . GERD (gastroesophageal reflux disease)   . Uterine fibroid     Dr. Deatra Ina    Past Surgical History  Procedure Laterality Date  . Cesarean section    . Colonoscopy  01/10/2009    tubular adenoma (Dr. Collene Mares); repeat every 5 years    Social History   Social History  . Marital Status: Married    Spouse Name: N/A  . Number of Children: 1  . Years of Education: N/A   Occupational History  . payroll/accounting    Social History Main Topics  . Smoking status: Never Smoker   . Smokeless tobacco: Never Used  . Alcohol Use: Yes     Comment: 1-2 drinks once a month  . Drug Use: No  . Sexual Activity:    Partners: Male  Birth Control/ Protection: Pill   Other Topics Concern  . Not on file   Social History Narrative   Lives with husband (out of town a lot, Administrator) and 1 daughter.  Works doing accounting/payroll (high stress job)    Family History  Problem Relation Age of Onset  . Hyperlipidemia Mother   . Hypertension Mother   . Colon polyps Father   . Hyperlipidemia Father   . Hypertension Father   . Gout Father   . Heart disease Maternal Grandfather   . Cancer Neg Hx   . Diabetes Neg Hx     Outpatient Encounter Prescriptions as of 12/24/2015  Medication Sig Note  . albuterol (PROVENTIL HFA;VENTOLIN HFA) 108 (90 BASE) MCG/ACT inhaler Inhale 2 puffs into the lungs every 6 (six) hours as  needed for wheezing or shortness of breath. You can use 15 mins prior to exercise also 12/24/2015: Uses about twice a month  . Ascorbic Acid (VITAMIN C PO) Take by mouth. Reported on 10/13/2015 11/28/2014: Uses occasionally--takes vitamin C or drinks juice  . IRON PO Take 130 mg by mouth daily. Reported on 10/13/2015   . Multiple Vitamins-Minerals (MULTI-VITAMIN GUMMIES PO) Take 1 each by mouth daily.     . TRI-SPRINTEC 0.18/0.215/0.25 MG-35 MCG TABS    . triamcinolone (NASACORT) 55 MCG/ACT AERO nasal inhaler Place 2 sprays into the nose daily.   . Esomeprazole Magnesium (NEXIUM 24HR PO) Take 1 capsule by mouth daily. Reported on 12/24/2015 12/24/2015: Uses prn   No facility-administered encounter medications on file as of 12/24/2015.    No Known Allergies  ROS: The patient denies anorexia, fever, weight changes, headaches, vision changes, decreased hearing, ear pain, sore throat, breast concerns, palpitations (has some intermittently, unchanged, infrequent, not associated with SOB), dizziness, syncope, cough, swelling, nausea, vomiting, diarrhea, constipation, abdominal pain, melena, hematochezia, hematuria, incontinence, dysuria, irregular menstrual cycles (heavier recently, even on OCP's), vaginal discharge, odor or itch, genital lesions, joint pains, numbness, tingling, weakness, tremor, suspicious skin lesions, depression, anxiety (some, intermittent related to stressful job), abnormal bleeding/bruising, or enlarged lymph nodes. Allergies are controlled with medications. +chest pain, belching, dyspnea --overall improved; see HPI Tingling in R hand as per HPI, intermittent. Some neck pain on right per HPI. Some fatigue--unsure if related to her heavier cycles.  She was checked for anemia last week, and got the call that it was normal.  Admits to only getting 6 hrs of sleep each night. Some low back pain, bilateral, intermittent. No radiation. Some intermittent left hip pain, with certain  position/stretches, not with walking/weight-bearing.   PHYSICAL EXAM:  BP 140/78 mmHg  Pulse 88  Ht 4' 11"  (1.499 m)  Wt 143 lb 12.8 oz (65.227 kg)  BMI 29.03 kg/m2  LMP 11/24/2015 130/70 on repeat by MD   General Appearance:  Alert, cooperative, no distress, appears stated age. Some throat-clearing noted during visit   Head:  Normocephalic, without obvious abnormality, atraumatic   Eyes:  PERRL, conjunctiva/corneas clear, EOM's intact, fundi  benign   Ears:  Normal TM's and external ear canals   Nose:  Nares normal, mucosa normal, mild edema, no drainage or sinus tenderness   Throat:  Lips, mucosa, and tongue normal; teeth and gums normal   Neck:  Supple, no lymphadenopathy; thyroid: no enlargement/tenderness/nodules; no carotid  bruit or JVD. Spine nontender.  Some tightness to trapezius muscles and paraspinous muscles R>L  Back:  Spine nontender, no curvature, ROM normal, no CVA tenderness   Lungs:  Clear to auscultation bilaterally  without wheezes, rales or ronchi; respirations unlabored   Chest Wall:  No tenderness or deformity   Heart:  Regular rate and rhythm, S1 and S2 normal, no murmur, rub  or gallop   Breast Exam:  Deferred to GYN   Abdomen:  Soft, non-tender, nondistended, normoactive bowel sounds,  no masses, no hepatosplenomegaly   Genitalia:  Deferred to GYN      Extremities:  No clubbing, cyanosis or edema. Negative Tinel, Phalen's  Pulses:  2+ and symmetric all extremities   Skin:  Skin color, texture, turgor normal, no rashes or lesions   Lymph nodes:  Cervical, supraclavicular, and axillary nodes normal   Neurologic:  CNII-XII intact, normal strength, sensation and gait; reflexes 2+ and symmetric throughout     Psych: Normal mood, affect, hygiene and grooming                ASSESSMENT/PLAN:  Annual physical exam - Plan: POCT Urinalysis  Dipstick  Other fatigue - Plan: Comprehensive metabolic panel, VITAMIN D 25 Hydroxy (Vit-D Deficiency, Fractures), TSH  Gastroesophageal reflux disease without esophagitis - reviewed diet, and use of Nexium prior to meals that trigger symptoms, vs acute meds   Allergic rhinitis, unspecified allergic rhinitis type - improved/controlled  Right hand paresthesia - 1st through 3rd fingers suggests CTS, normal eval today. 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 Trial stretches, heat, massage.  SOB--improved, likely some RAD component, continue albuterol prn. GERD--encouraged her to use nexium prior to meal which trigger, rather than prn when HAS symptoms.  Fatigue--get CBC from Dr. Deatra Ina (sign release). c-met, TSH and Vitamin D today   Discussed monthly self breast exams and yearly mammograms; at least 30 minutes of aerobic activity at least 5 days/week, weight-bearing exercise 2-3x/wk; proper sunscreen use reviewed; healthy diet, including goals of calcium and vitamin D intake and alcohol recommendations (less than or equal to 1 drink/day) reviewed; regular seatbelt use; changing batteries in smoke detectors. Immunization recommendations discussed. Flu shots recommended yearly. Colonoscopy was due in June 2015--to call Dr. Collene Mares to schedule  Try and sleep at least 7-8 hours/night.  Be sure to stretch regularly, and work on core strengthening (yoga would be good)   F/u 1 year, sooner prn

## 2015-12-25 ENCOUNTER — Telehealth: Payer: Self-pay

## 2015-12-25 ENCOUNTER — Encounter: Payer: Self-pay | Admitting: Family Medicine

## 2015-12-25 LAB — TSH: TSH: 1.64 mIU/L

## 2015-12-25 LAB — VITAMIN D 25 HYDROXY (VIT D DEFICIENCY, FRACTURES): Vit D, 25-Hydroxy: 42 ng/mL (ref 30–100)

## 2015-12-25 NOTE — Telephone Encounter (Signed)
Records- OV note and labs- CBC- sent back in you folder.

## 2015-12-29 ENCOUNTER — Encounter: Payer: Self-pay | Admitting: Family Medicine

## 2015-12-29 ENCOUNTER — Encounter: Payer: Self-pay | Admitting: *Deleted

## 2016-03-18 ENCOUNTER — Other Ambulatory Visit: Payer: Self-pay | Admitting: Obstetrics & Gynecology

## 2016-03-22 LAB — CYTOLOGY - PAP

## 2016-09-02 ENCOUNTER — Encounter: Payer: Self-pay | Admitting: Family Medicine

## 2016-09-02 ENCOUNTER — Ambulatory Visit (INDEPENDENT_AMBULATORY_CARE_PROVIDER_SITE_OTHER): Payer: Managed Care, Other (non HMO) | Admitting: Family Medicine

## 2016-09-02 VITALS — BP 130/74 | HR 108 | Temp 98.3°F | Ht 59.0 in | Wt 162.8 lb

## 2016-09-02 DIAGNOSIS — R059 Cough, unspecified: Secondary | ICD-10-CM

## 2016-09-02 DIAGNOSIS — J069 Acute upper respiratory infection, unspecified: Secondary | ICD-10-CM

## 2016-09-02 DIAGNOSIS — R0609 Other forms of dyspnea: Secondary | ICD-10-CM | POA: Diagnosis not present

## 2016-09-02 DIAGNOSIS — R52 Pain, unspecified: Secondary | ICD-10-CM | POA: Diagnosis not present

## 2016-09-02 DIAGNOSIS — R05 Cough: Secondary | ICD-10-CM

## 2016-09-02 LAB — POC INFLUENZA A&B (BINAX/QUICKVUE)
INFLUENZA A, POC: NEGATIVE
INFLUENZA B, POC: NEGATIVE

## 2016-09-02 MED ORDER — ALBUTEROL SULFATE HFA 108 (90 BASE) MCG/ACT IN AERS: 2.0000 | INHALATION_SPRAY | Freq: Four times a day (QID) | RESPIRATORY_TRACT | 1 refills | Status: DC | PRN

## 2016-09-02 NOTE — Progress Notes (Signed)
Chief Complaint  Patient presents with  . Cough    started Monday, yellow in color and is in her sinuses and chest. When she coughs it up it is brown in color. No fevers, but hasn't checked. Facial pain and pressure as well as teeth pain. (requested to be checked for influenza)   3 days ago she started with runny nose, sneezing, cough, slight body aches, headaches.  She is having some pain in her jaw (upper and lower).  Headaches are frontal.  Eyes are painful; not itchy.  Mucus from nose was clear at first, but yellow since yesterday.  Cough was more initially, only slight now.  Occasionally gets up some brown phlegm. It burns in her chest when she coughs.  Slightly winded with activity. Her albuterol has expired so didn't try using it. Felt hot periodically, no chills.  [id number] this morning before tylenol.  She has been taking Mucinex severe congestion (Liquid) every 4 hours, with some benefit.  Also taking tylenol for headaches, last dose 3 hours ago.  +sick coworkers, no known influenza exposure.  PMH, PSH, SH reviewed  Outpatient Encounter Prescriptions as of 09/02/2016  Medication Sig Note  . acetaminophen (TYLENOL) 500 MG tablet Take 500 mg by mouth every 6 (six) hours as needed.   . Ascorbic Acid (VITAMIN C PO) Take by mouth. Reported on 10/13/2015 11/28/2014: Uses occasionally--takes vitamin C or drinks juice  . GuaiFENesin (MUCINEX PO) Take 1 tablet by mouth every 4 (four) hours as needed. 09/02/2016: Using liquid form, with decongestant and cough suppressant, 4 hour version  . IRON PO Take 130 mg by mouth daily. Reported on 10/13/2015   . levonorgestrel-ethinyl estradiol (SEASONALE,INTROVALE,JOLESSA) 0.15-0.03 MG tablet Take 1 tablet by mouth daily.    . Multiple Vitamins-Minerals (MULTI-VITAMIN GUMMIES PO) Take 1 each by mouth daily.     [name] Kitchen albuterol (PROVENTIL HFA;VENTOLIN HFA) 108 (90 Base) MCG/ACT inhaler Inhale 2 puffs into the lungs every 6 (six) hours as needed for wheezing or  shortness of breath. You can use 15 mins prior to exercise also   . Esomeprazole Magnesium (NEXIUM 24HR PO) Take 1 capsule by mouth daily. Reported on 12/24/2015 12/24/2015: Uses prn  . triamcinolone (NASACORT) 55 MCG/ACT AERO nasal inhaler Place 2 sprays into the nose daily. (Patient not taking: Reported on 09/02/2016)   . [DISCONTINUED] albuterol (PROVENTIL HFA;VENTOLIN HFA) 108 (90 BASE) MCG/ACT inhaler Inhale 2 puffs into the lungs every 6 (six) hours as needed for wheezing or shortness of breath. You can use 15 mins prior to exercise also (Patient not taking: Reported on 09/02/2016) 12/24/2015: Uses about twice a month  . [DISCONTINUED] TRI-SPRINTEC 0.18/0.215/0.25 MG-35 MCG TABS     No facility-administered encounter medications on file as of 09/02/2016.    No Known Allergies  ROS: no fever, chills, chest pain, palpitations, nausea, vomiting, diarrhea, rash, joint pains. See HPI.   PHYSICAL EXAM:  BP 130/74 (BP Location: Left Arm, Patient Position: Sitting, Cuff Size: Normal)   Pulse (!) 108   Temp 98.3 F (36.8 C) (Tympanic)   Ht 4\' 11"  (1.499 m)   Wt 162 lb 12.8 oz (73.8 kg)   LMP 06/14/2016 (Approximate)   SpO2 99%   BMI 32.88 kg/m   Well appearing female with frequent sniffling during visit. No coughing HEENT: PERRL, EOMI, conjunctiva and sclera clear.  TM's and EAC's normal. Nasal mucosa is moderately-severely edematous R>L.  Clear mucus on the right.  Left is less swollen, some erythema and recent bleed, slight yellow crust.  Sinuses are nontender OP is clear, some erythema posteriorly. Very mildly tender across both maxillary sinuses, nontender over frontal sinuses.  Also tender at [facility] and over jaw muscles bilaterally (admits to clenching) Neck: no lymphadenopathy or mass Heart: regular rate and rhythm without murmur--borderline tachycardia (100) Lungs: clear bilateraly, no wheezes, rales, ronchi Abdomen: soft, nontender Extremities: no edema Skin: normal turgor, no  rash Psych: normal mood, affect, hygiene and grooming Neuro: alert and oriented, cranial nerves intact, normal strength, gait   Influenza tests negative   ASSESSMENT/PLAN:  Acute upper respiratory infection  Cough - Plan: POC Influenza A&B (Binax test)  Body aches - Plan: POC Influenza A&B (Binax test)  Dyspnea on exertion - Plan: albuterol (PROVENTIL HFA;VENTOLIN HFA) 108 (90 Base) MCG/ACT inhaler  Supportive measures reviewed, and s/sx bacterial infection. Call/return if not improving.   Drink plenty of water. Salt water gargles and tylenol as needed for sore throat. Your heart rate was a little elevated (which can be from coughing, being sick, fever, dehydration and/or decongestants). It is okay to continue the mucinex--but you might want to hold off if your pulse is over 100 (might make it faster, and feel worse)  Return if you develop high fever, worsening cough, especially if productive of discolored mucus or any worsening shortness of breath or pain with breathing. Look at your mucinex bottle to ensure that it contains guaifenesin.  If for some reason it doesn't say guaifenesin as ingredient (expectorant) then you can take a separate plain mucinex.  Most DO contain it, but I believe not all.  Your flu tests were negative.  I refilled your albuterol to use as needed for shortness of breath and wheezing.  I encourage you to please get the flu shot when you are recovered from this illness--ie next week.

## 2016-09-02 NOTE — Patient Instructions (Signed)
  Drink plenty of water. Salt water gargles and tylenol as needed for sore throat. Your heart rate was a little elevated (which can be from coughing, being sick, fever, dehydration and/or decongestants). It is okay to continue the mucinex--but you might want to hold off if your pulse is over 100 (might make it faster, and feel worse)  Return if you develop high fever, worsening cough, especially if productive of discolored mucus or any worsening shortness of breath or pain with breathing. Look at your mucinex bottle to ensure that it contains guaifenesin.  If for some reason it doesn't say guaifenesin as ingredient (expectorant) then you can take a separate plain mucinex.  Most DO contain it, but I believe not all.  Your flu tests were negative.  I refilled your albuterol to use as needed for shortness of breath and wheezing.  I encourage you to please get the flu shot when you are recovered from this illness--ie next week.

## 2016-12-27 ENCOUNTER — Encounter: Payer: Self-pay | Admitting: Family Medicine

## 2017-02-11 ENCOUNTER — Encounter: Payer: Self-pay | Admitting: Family Medicine

## 2017-02-11 ENCOUNTER — Ambulatory Visit
Admission: RE | Admit: 2017-02-11 | Discharge: 2017-02-11 | Disposition: A | Discharge status: Home / Self Care | Payer: No Typology Code available for payment source | Source: Ambulatory Visit | Attending: Family Medicine | Admitting: Family Medicine

## 2017-02-11 ENCOUNTER — Ambulatory Visit (INDEPENDENT_AMBULATORY_CARE_PROVIDER_SITE_OTHER): Payer: Self-pay | Admitting: Family Medicine

## 2017-02-11 VITALS — BP 128/78 | HR 88 | Temp 97.7°F | Wt 167.4 lb

## 2017-02-11 DIAGNOSIS — S99921A Unspecified injury of right foot, initial encounter: Secondary | ICD-10-CM

## 2017-02-11 NOTE — Progress Notes (Signed)
   Subjective:    Patient ID: Amy Hill, female    DOB: 1965/09/08, 51 y.o.   MRN: [id number]  HPI Chief Complaint  Patient presents with  . hit toe on bathroom door    hit toe on bathroom door july 4th. hit toe beside pinkey toe   She is here with complaints of pain and possible dislocation of the 4th toe on her right foot since hitting it on the door 2 days ago. Pain is improving per patient. She thinks it still looks crooked. No other injuries. No other complaints.  Denies foot pain. No numbness, tingling or weakness.    Review of Systems Pertinent positives and negatives in the history of present illness.     Objective:   Physical Exam  Musculoskeletal:       Right ankle: Normal.       Right foot: There is normal range of motion, no tenderness and normal capillary refill.  4th toe on right foot slightly deviated compared to same toe on left foot. Bruising at base of toe. Otherwise normal right foot exam.  Skin intact.  Normal cap refill, sensation, pulses, ROM.    BP 128/78   Pulse 88   Temp 97.7 F (36.5 C) (Oral)   Wt 167 lb 6.4 oz (75.9 kg)   SpO2 98%   BMI 33.81 kg/m       Assessment & Plan:  Toe injury, right, initial encounter - Plan: DG Toe 4th Right  Discussed options such as XR or just doing supportive care. She would like to get an XR.  Recommend buddy tape and Ibuprofen or Tylenol as needed for pain.  Will follow up pending XR.

## 2017-02-23 ENCOUNTER — Telehealth: Payer: Self-pay | Admitting: Family Medicine

## 2017-02-23 NOTE — Telephone Encounter (Signed)
Sorry forgot to put in there a rx for a foot boot

## 2017-02-23 NOTE — Telephone Encounter (Signed)
Pt is alittle bit better. Feels its moving up her foot. Pt has been taking tyenlelol for this. A rx was written for a wooden shoe and pt can wear for next week. If not any better, we will need to refer to ortho. Pt is aware

## 2017-02-23 NOTE — Telephone Encounter (Signed)
Called and left a message for pt to call me back 

## 2017-02-23 NOTE — Telephone Encounter (Signed)
Pt called back and is wanting a RX for a boot states she is still in a lot of pain she states she is about 40 percent better, she can be reached at [phone] pt uses [id number] - [place], [place].

## 2017-02-23 NOTE — Telephone Encounter (Signed)
Please call and find out what type of boot she is requesting. Is the pain any better? Is she taking anything for the pain?

## 2017-04-06 DIAGNOSIS — Z1231 Encounter for screening mammogram for malignant neoplasm of breast: Secondary | ICD-10-CM | POA: Diagnosis not present

## 2017-04-06 DIAGNOSIS — Z01419 Encounter for gynecological examination (general) (routine) without abnormal findings: Secondary | ICD-10-CM | POA: Diagnosis not present

## 2017-05-26 ENCOUNTER — Encounter: Payer: Self-pay | Admitting: Family Medicine

## 2017-05-31 NOTE — Progress Notes (Signed)
Chief Complaint  Patient presents with  . Annual Exam    fasting annual exam, no pap-sees Dr Deatra Ina and is UTD. Had eye exam and will see eye doctor again this year. Started having joing pain of the left hand about 10 days ago as well as left shoulder pain x 1 year but has worsened over the last few months and she thinks it's from walking her [place].   . Flu Vaccine    declined.      Amy Hill is a 51 y.o. female who presents for a complete physical. She saw Dr. Deatra Ina in August for her GYN exam. She has known fibroid uterus.  Bleeding has been lighter than in the past, on [place].  She reports some intermittent pain in left hand x 10 days. She hurts in her finger joints. This morning while at work she had some discomfort, none now.  Left shoulder pain x 1 year, a little worse in the last few months. She got a puppy in February, but pain restarted just in the last month or two.  Pain with certain movements, like reaching behind her.  Hasn't tried any treatments.   Weight gain.  Got a dog, which has changed up her routines, not getting to the gym.  Eating later, grabbing more convenience foods rather than cooking.  Exercising less (different routine, just walking the dog).  No longer has any chest pain or shortness of breath (as in prior visits, none in the last year).  She no longer has any problems with dyspnea going up and down the steps, but does notice a difference in her breathing since she gained weight.  Allergies--symptoms are sporadic, sometimes in spring, sometimes in Fall. Not currently bothering her.  Nasacort is effective when symptomatic.  GERD: She is not currently taking any medication.  Uses Nexium just as needed--hasn't needed any in 6-8 months.  Immunization History  Administered Date(s) Administered  . Td 04/09/2002  . Tdap 01/21/2011   doesn't get flu shots Last Pap smear: through GYN, 03/2016 normal pap, negative HPV Last mammogram: 03/2017 Last  colonoscopy: 01/2009, Dr. Collene Mares; due again 01/2014 but didn't have.  Last DEXA: never Ophtho: yearly Dentist: about 4 years ago Exercise: Nothing, other than walking the dog 30 minutes most days. (previously walked 3x/week, 30 minutes or longer, also some exercise bike, upper body weights 3x/wk.)  Vitamin D level: 42 in 12/2015 Lipids:  Lab Results  Component Value Date   CHOL 207 (H) 11/01/2013   HDL 84 11/01/2013   LDLCALC 110 (H) 11/01/2013   TRIG 64 11/01/2013   CHOLHDL 2.5 11/01/2013    Past Medical History:  Diagnosis Date  . Adenomatous colon polyp 01/2009  . Allergy   . Contraceptive management   . Frequent UTI   . GERD (gastroesophageal reflux disease)   . Migraine   . Uterine fibroid    Dr. Deatra Ina    Past Surgical History:  Procedure Laterality Date  . CESAREAN SECTION    . COLONOSCOPY  01/10/2009   tubular adenoma (Dr. Collene Mares); repeat every 5 years    Social History   Social History  . Marital status: Married    Spouse name: N/A  . Number of children: 1  . Years of education: N/A   Occupational History  . payroll/accounting [employer]   Social History Main Topics  . Smoking status: Never Smoker  . Smokeless tobacco: Never Used  . Alcohol use Yes     Comment:  1-2 drinks once a month  . Drug use: No  . Sexual activity: Yes    Partners: Male    Birth control/ protection: Pill   Other Topics Concern  . Not on file   Social History Narrative   Lives with husband (out of town a lot, Administrator) and 1 daughter.  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 Hyperlipidemia Mother   . Hypertension Mother   . Colon polyps Father   . Hyperlipidemia Father   . Hypertension Father   . Gout Father   . Heart disease Maternal Grandfather   . Cancer Neg Hx   . Diabetes Neg Hx     Outpatient Encounter Prescriptions as of 06/01/2017  Medication Sig Note  .  levonorgestrel-ethinyl estradiol (SEASONALE,INTROVALE,JOLESSA) 0.15-0.03 MG tablet Take 1 tablet by mouth daily.    . Multiple Vitamins-Minerals (MULTI-VITAMIN GUMMIES PO) Take 1 each by mouth daily.     . [DISCONTINUED] IRON PO Take 130 mg by mouth daily. Reported on 10/13/2015   . acetaminophen (TYLENOL) 500 MG tablet Take 500 mg by mouth every 6 (six) hours as needed.   [name] Kitchen albuterol (PROVENTIL HFA;VENTOLIN HFA) 108 (90 Base) MCG/ACT inhaler Inhale 2 puffs into the lungs every 6 (six) hours as needed for wheezing or shortness of breath. You can use 15 mins prior to exercise also (Patient not taking: Reported on 06/01/2017)   . Ascorbic Acid (VITAMIN C PO) Take by mouth. Reported on 10/13/2015 11/28/2014: Uses occasionally--takes vitamin C or drinks juice  . Esomeprazole Magnesium (NEXIUM 24HR PO) Take 1 capsule by mouth daily. Reported on 12/24/2015 12/24/2015: Uses prn  . GuaiFENesin (MUCINEX PO) Take 1 tablet by mouth every 4 (four) hours as needed. 09/02/2016: Using liquid form, with decongestant and cough suppressant, 4 hour version  . triamcinolone (NASACORT) 55 MCG/ACT AERO nasal inhaler Place 2 sprays into the nose daily. (Patient not taking: Reported on 06/01/2017)    No facility-administered encounter medications on file as of 06/01/2017.     No Known Allergies   ROS: The patient denies anorexia, fever, weight changes, headaches, vision changes, decreased hearing, ear pain, sore throat, breast concerns, chest pain, palpitations, dizziness, syncope, cough, swelling, nausea, vomiting, diarrhea, constipation, abdominal pain, melena, hematochezia, hematuria, incontinence, dysuria, irregular menstrual cycles, vaginal discharge, odor or itch, genital lesions, joint pains, numbness, tingling, weakness, tremor, suspicious skin lesions, depression, anxiety, abnormal bleeding/bruising, or enlarged lymph nodes. Allergies are controlled with medications. Some sporadic low back pain, bilateral,  intermittent. No radiation. Left shoulder pain and finger pain as per HPI  11# weight gain since January, up 20# since 12/2015   PHYSICAL EXAM:  BP 138/82   Pulse 92   Ht 4\' 11"  (1.499 m)   Wt 173 lb (78.5 kg)   LMP 03/23/2017 (Approximate)   BMI 34.94 kg/m    134/70 on repeat by MD  Wt Readings from Last 3 Encounters:  02/11/17 167 lb 6.4 oz (75.9 kg)  09/02/16 162 lb 12.8 oz (73.8 kg)  12/24/15 143 lb 12.8 oz (65.2 kg)    General Appearance:  Alert, cooperative, no distress, appears stated age.  Head:  Normocephalic, without obvious abnormality, atraumatic   Eyes:  PERRL, conjunctiva/corneas clear, EOM's intact, fundi benign   Ears:  Normal TM's and external ear canals   Nose:  Nares normal, mucosa normal, no drainage or sinus tenderness   Throat:  Lips, mucosa, and tongue  normal; teeth and gums normal   Neck:  Supple, no lymphadenopathy; thyroid: no enlargement/tenderness/nodules; no carotid bruit or JVD. Spine nontender.   Back:  Spine nontender, no curvature, ROM normal, no CVA tenderness   Lungs:  Clear to auscultation bilaterally without wheezes, rales or ronchi; respirations unlabored   Chest Wall:  No tenderness or deformity   Heart:  Regular rate and rhythm, S1 and S2 normal, no murmur, rub or gallop   Breast Exam:  Deferred to GYN   Abdomen:  Soft, non-tender, nondistended, normoactive bowel sounds,  no masses, no hepatosplenomegaly   Genitalia:  Deferred to GYN      Extremities:  No clubbing, cyanosis or edema. FROM of shoulders. Pain with internal rotation of left shoulder.  Mildly tender at anterior left shoulder.  Normal strength. No impingement. Hands appear normal, without effusion, swelling, warmth, FROM of fingers  Pulses:  2+ and symmetric all extremities   Skin:  Skin color, texture, turgor normal, no rashes or lesions   Lymph nodes:  Cervical, supraclavicular, and axillary nodes  normal   Neurologic:  CNII-XII intact, normal strength, sensation and gait; reflexes 2+ and symmetric throughout     Psych:  Normal mood, affect, hygiene and grooming     ASSESSMENT/PLAN:  Annual physical exam - Plan: POCT Urinalysis DIP (Proadvantage Device), TSH, Lipid panel, Glucose, random, CBC with Differential/Platelet  Acute pain of left shoulder - suspect tendonitis (subscapularis).  ROM shown; OTC Aleve for 10 days, rest  Hand pain, left - normal exam, reassured  Weight gain - counseled extensively re: diet and exercise, risks of obesity - Plan: TSH  Need for influenza vaccination - risks/side effects reviewed - Plan: Flu Vaccine QUAD 6+ mos PF IM (Fluarix Quad PF)   Lipids, TSH, glu, CBC  Discussed monthly self breast exams and yearly mammograms; at least 30 minutes of aerobic activity at least 5 days/week, weight-bearing exercise 2-3x/wk; proper sunscreen use reviewed; healthy diet, including goals of calcium and vitamin D intake and alcohol recommendations (less than or equal to 1 drink/day) reviewed; regular seatbelt use; changing batteries in smoke detectors. Immunization recommendations discussed. Flu shots recommended yearly--given today. Shingrix recommended age 62. Colonoscopy was due in June 2015--to call Dr. Collene Mares to schedule  Schedule dental exam.   Try taking Aleve (1-2 tablets) twice daily for up to 10 days, along with resting the left arm/shoulder.  This should help treat a tendonitis.  If pain persists/worsens, next steps would be x-ray, physical therapy and/or cortisone shot. Do regular range of motion exercises as shown, to prevent stiffness.   F/u 1 year, sooner prn

## 2017-06-01 ENCOUNTER — Encounter: Payer: Self-pay | Admitting: Family Medicine

## 2017-06-01 ENCOUNTER — Ambulatory Visit (INDEPENDENT_AMBULATORY_CARE_PROVIDER_SITE_OTHER): Payer: 59 | Admitting: Family Medicine

## 2017-06-01 VITALS — BP 134/70 | HR 92 | Ht 59.0 in | Wt 173.0 lb

## 2017-06-01 DIAGNOSIS — M79642 Pain in left hand: Secondary | ICD-10-CM | POA: Diagnosis not present

## 2017-06-01 DIAGNOSIS — M25512 Pain in left shoulder: Secondary | ICD-10-CM

## 2017-06-01 DIAGNOSIS — R635 Abnormal weight gain: Secondary | ICD-10-CM | POA: Diagnosis not present

## 2017-06-01 DIAGNOSIS — Z23 Encounter for immunization: Secondary | ICD-10-CM | POA: Diagnosis not present

## 2017-06-01 DIAGNOSIS — Z0000 Encounter for general adult medical examination without abnormal findings: Secondary | ICD-10-CM

## 2017-06-01 LAB — POCT URINALYSIS DIP (PROADVANTAGE DEVICE)
BILIRUBIN UA: NEGATIVE mg/dL
Bilirubin, UA: NEGATIVE
Glucose, UA: NEGATIVE mg/dL
Leukocytes, UA: NEGATIVE
Nitrite, UA: NEGATIVE
PH UA: 6 (ref 5.0–8.0)
PROTEIN UA: NEGATIVE mg/dL
RBC UA: NEGATIVE
SPECIFIC GRAVITY, URINE: 1.02
UUROB: NEGATIVE

## 2017-06-01 NOTE — Patient Instructions (Addendum)
  HEALTH MAINTENANCE RECOMMENDATIONS:  It is recommended that you get at least 30 minutes of aerobic exercise at least 5 days/week (for weight loss, you may need as much as 60-90 minutes). This can be any activity that gets your heart rate up. This can be divided in 10-15 minute intervals if needed, but try and build up your endurance at least once a week.  Weight bearing exercise is also recommended twice weekly.  Eat a healthy diet with lots of vegetables, fruits and fiber.  "Colorful" foods have a lot of vitamins (ie green vegetables, tomatoes, red peppers, etc).  Limit sweet tea, regular sodas and alcoholic beverages, all of which has a lot of calories and sugar.  Up to 1 alcoholic drink daily may be beneficial for women (unless trying to lose weight, watch sugars).  Drink a lot of water.  Calcium recommendations are 1200-1500 mg daily (1500 mg for postmenopausal women or women without ovaries), and vitamin D 1000 IU daily.  This should be obtained from diet and/or supplements (vitamins), and calcium should not be taken all at once, but in divided doses.  Monthly self breast exams and yearly mammograms for women over the age of 70 is recommended.  Sunscreen of at least SPF 30 should be used on all sun-exposed parts of the skin when outside between the hours of 10 am and 4 pm (not just when at beach or pool, but even with exercise, golf, tennis, and yard work!)  Use a sunscreen that says "broad spectrum" so it covers both UVA and UVB rays, and make sure to reapply every 1-2 hours.  Remember to change the batteries in your smoke detectors when changing your clock times in the spring and fall.  Use your seat belt every time you are in a car, and please drive safely and not be distracted with cell phones and texting while driving.  I recommend getting the new shingles vaccine (Shingrix). You will need to check with your insurance to see if it is covered, and if covered by Medicare Part D, you need to  get from the pharmacy rather than our office.  It is a series of 2 injections, spaced 2 months apart.  Call Dr. Collene Mares to schedule your colonoscopy--you are past due (was due 01/2014, a 5 year follow-up due to having adenomatous colon polyp on your first colonoscopy in 01/2009).  Schedule a dental visit. These are recommended every 6 months.  Try taking Aleve (1-2 tablets) twice daily for up to 10 days, along with resting the left arm/shoulder.  This should help treat a tendonitis.  If pain persists/worsens, next steps would be x-ray, physical therapy and/or cortisone shot. Do regular range of motion exercises as shown, to prevent stiffness.

## 2017-06-02 ENCOUNTER — Encounter: Payer: Self-pay | Admitting: Family Medicine

## 2017-06-02 LAB — LIPID PANEL
CHOLESTEROL: 184 mg/dL (ref <200)
HDL: 61 mg/dL (ref >50)
LDL Cholesterol (Calc): 108 mg/dL (calc) — ABNORMAL HIGH
Non-HDL Cholesterol (Calc): 123 mg/dL (calc) (ref <130)
Total CHOL/HDL Ratio: 3 (calc) (ref <5.0)
Triglycerides: 67 mg/dL (ref <150)

## 2017-06-02 LAB — CBC WITH DIFFERENTIAL/PLATELET
BASOS PCT: 0.7 %
Basophils Absolute: 61 cells/uL (ref 0–200)
EOS ABS: 78 {cells}/uL (ref 15–500)
Eosinophils Relative: 0.9 %
HEMATOCRIT: 38.9 % (ref 35.0–45.0)
HEMOGLOBIN: 12.9 g/dL (ref 11.7–15.5)
LYMPHS ABS: 2340 {cells}/uL (ref 850–3900)
MCH: 27 pg (ref 27.0–33.0)
MCHC: 33.2 g/dL (ref 32.0–36.0)
MCV: 81.4 fL (ref 80.0–100.0)
MONOS PCT: 9 %
MPV: 12.2 fL (ref 7.5–12.5)
NEUTROS ABS: 5438 {cells}/uL (ref 1500–7800)
Neutrophils Relative %: 62.5 %
Platelets: 276 10*3/uL (ref 140–400)
RBC: 4.78 10*6/uL (ref 3.80–5.10)
RDW: 13.4 % (ref 11.0–15.0)
Total Lymphocyte: 26.9 %
WBC: 8.7 10*3/uL (ref 3.8–10.8)
WBCMIX: 783 {cells}/uL (ref 200–950)

## 2017-06-02 LAB — TSH: TSH: 1.41 mIU/L

## 2017-06-02 LAB — GLUCOSE, RANDOM: Glucose, Bld: 82 mg/dL (ref 65–99)

## 2017-12-15 ENCOUNTER — Encounter: Payer: Self-pay | Admitting: Family Medicine

## 2018-05-05 DIAGNOSIS — Z1231 Encounter for screening mammogram for malignant neoplasm of breast: Secondary | ICD-10-CM | POA: Diagnosis not present

## 2018-05-05 DIAGNOSIS — Z01419 Encounter for gynecological examination (general) (routine) without abnormal findings: Secondary | ICD-10-CM | POA: Diagnosis not present

## 2018-05-09 LAB — HM MAMMOGRAPHY

## 2018-05-17 ENCOUNTER — Telehealth: Payer: Self-pay | Admitting: Family Medicine

## 2018-05-17 NOTE — Telephone Encounter (Signed)
Received requested records from [facility]. Sending back for review.

## 2018-06-06 NOTE — Progress Notes (Signed)
Chief Complaint  Patient presents with  . Annual Exam    fasting annual exam no pap-sees Dr.Kaplan and is UTD. Will schedule eye exam. Only concern is increased anxiety and stress due to husbands new illness (prostate cancer and kidney disease).     Amy Hill is a 52 y.o. female who presents for a complete physical.    She had a tick bite in May. Never had rash, body aches, fever.  It was on her right upper arm.  She still has a mark.  It was itchy for a while, but otherwise no issues. After reading about Zigmund Daniel Hagan's encephalitis (she just passed away), she wanted to mention this.  She has not had any symptoms/problems.  Husband was hospitalized in May with kidney failure, and stage 4 prostate cancer. She reports he had been more fatigued for a few months prior. Kidney function has improved some, not needing dialysis.  Cancer is being treated with injections q6 mos (not a candidate for chemo or radiation due to his kidney, per pt). Tumors were causing hydronephrosis, has stents.  He has a lot of back pain.  "I'm trying to handle everything"--working, handling the house, 13yo child.   She has known fibroid uterus.  Bleeding has been lighter than in the past, on [place], only every 3 months, no breakthrough.  She sees Dr. Deatra Ina, saw him last in September.  Allergies--symptoms are sporadic, sometimes in spring, sometimes in Fall. Not currently bothering her.  Nasacort is effective when symptomatic.  GERD: She is not currently taking any medication. Uses Nexium prn, very rarely, just a few times a year.  Left shoulder pain reported last year, at that time had been ongoing for about a year. She has some persistent discomfort in her left shoulder, only with certain positions/movement of her arm, overall not very bothersome.  She thinks it is related to her [place] (pulls while walking).  No specific injury.  She tried NSAIDs last year, didn't help.  Immunization History   Administered Date(s) Administered  . Influenza,inj,Quad PF,6+ Mos 06/01/2017  . Td 04/09/2002  . Tdap 01/21/2011   Last Pap smear: through GYN, 03/2016 normal pap, negative HPV Last mammogram: 05/2018 Last colonoscopy: 01/2009, Dr. Collene Mares; due again 01/2014 but didn't have.  Last DEXA: never Ophtho: yearly, past due this year Dentist: about 4-5 years ago Exercise: "not a whole lot", walks the dog 1/2 mile once a week (previously walked 3x/week, 30 minutes or longer, also some exercise bike, upper body weights 3x/wk. No longer goes to the gym)  Vitamin D level: 42 in 12/2015 Lipids: Lab Results  Component Value Date   CHOL 184 06/01/2017   HDL 61 06/01/2017   LDLCALC 108 (H) 06/01/2017   TRIG 67 06/01/2017   CHOLHDL 3.0 06/01/2017   Past Medical History:  Diagnosis Date  . Adenomatous colon polyp 01/2009  . Allergy   . Contraceptive management   . Frequent UTI   . GERD (gastroesophageal reflux disease)   . Migraine   . Uterine fibroid    Dr. Deatra Ina    Past Surgical History:  Procedure Laterality Date  . CESAREAN SECTION    . COLONOSCOPY  01/10/2009   tubular adenoma (Dr. Collene Mares); repeat every 5 years    Social History   Socioeconomic History  . Marital status: Married    Spouse name: Not on file  . Number of children: 1  . Years of education: Not on file  . Highest education level: Not on  file  Occupational History  . Occupation: Product manager: [employer]  Social Needs  . Financial resource strain: Not on file  . Food insecurity:    Worry: Not on file    Inability: Not on file  . Transportation needs:    Medical: Not on file    Non-medical: Not on file  Tobacco Use  . Smoking status: Never Smoker  . Smokeless tobacco: Never Used  Substance and Sexual Activity  . Alcohol use: Yes    Comment: 1-2 drinks once a month  . Drug use: No  . Sexual activity: Yes    Partners: Male    Birth control/protection: Pill  Lifestyle  . Physical  activity:    Days per week: Not on file    Minutes per session: Not on file  . Stress: Not on file  Relationships  . Social connections:    Talks on phone: Not on file    Gets together: Not on file    Attends religious service: Not on file    Active member of club or organization: Not on file    Attends meetings of clubs or organizations: Not on file    Relationship status: Not on file  . Intimate partner violence:    Fear of current or ex partner: Not on file    Emotionally abused: Not on file    Physically abused: Not on file    Forced sexual activity: Not on file  Other Topics Concern  . Not on file  Social History Narrative   Lives with husband (used to be out of town a lot, Administrator) and 1 daughter. Got a [place] 2018. 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   Husband diagnosed with stage 4 prostate cancer and kidney disease 12/2017.    Family History  Problem Relation Age of Onset  . Hyperlipidemia Mother   . Hypertension Mother   . Colon polyps Father   . Hyperlipidemia Father   . Hypertension Father   . Gout Father   . Heart disease Maternal Grandfather   . Cancer Neg Hx   . Diabetes Neg Hx     Outpatient Encounter Medications as of 06/07/2018  Medication Sig Note  . levonorgestrel-ethinyl estradiol (SEASONALE,INTROVALE,JOLESSA) 0.15-0.03 MG tablet Take 1 tablet by mouth daily.    . Multiple Vitamins-Minerals (MULTI-VITAMIN GUMMIES PO) Take 1 each by mouth daily.     [name] Kitchen acetaminophen (TYLENOL) 500 MG tablet Take 500 mg by mouth every 6 (six) hours as needed.   [name] Kitchen albuterol (PROVENTIL HFA;VENTOLIN HFA) 108 (90 Base) MCG/ACT inhaler Inhale 2 puffs into the lungs every 6 (six) hours as needed for wheezing or shortness of breath. You can use 15 mins prior to exercise also (Patient not taking: Reported on 06/01/2017)   . Ascorbic Acid (VITAMIN C PO) Take by mouth. Reported on 10/13/2015 11/28/2014: Uses occasionally--takes vitamin  C or drinks juice  . Esomeprazole Magnesium (NEXIUM 24HR PO) Take 1 capsule by mouth daily. Reported on 12/24/2015 12/24/2015: Uses prn  . GuaiFENesin (MUCINEX PO) Take 1 tablet by mouth every 4 (four) hours as needed. 09/02/2016: Using liquid form, with decongestant and cough suppressant, 4 hour version  . triamcinolone (NASACORT) 55 MCG/ACT AERO nasal inhaler Place 2 sprays into the nose daily. (Patient not taking: Reported on 06/01/2017)    No facility-administered encounter medications on file as of 06/07/2018.     No Known Allergies   ROS: The patient denies anorexia,  fever, weight changes, headaches, vision changes, decreased hearing, ear pain, sore throat, breast concerns, chest pain, palpitations, dizziness, syncope, cough, swelling, nausea, vomiting, diarrhea, constipation, abdominal pain, melena, hematochezia, hematuria, incontinence, dysuria, irregular menstrual cycles, vaginal discharge, odor or itch, genital lesions, joint pains, numbness, tingling, weakness, tremor, suspicious skin lesions, depression, abnormal bleeding/bruising, or enlarged lymph nodes. Allergies are controlled with prn medications. Left shoulder pain--with certain movements, not bothersome overall. Some anxiety per HPI, related to her husband's health   PHYSICAL EXAM:  BP (!) 150/90   Pulse 100   Ht 4\' 11"  (1.499 m)   Wt 171 lb 3.2 oz (77.7 kg)   LMP 03/07/2018 (Approximate)   BMI 34.58 kg/m   146/78 on repeat by MD  Wt Readings from Last 3 Encounters:  06/07/18 171 lb 3.2 oz (77.7 kg)  06/01/17 173 lb (78.5 kg)  02/11/17 167 lb 6.4 oz (75.9 kg)    General Appearance:  Alert, cooperative, no distress, appears stated age.  Head:  Normocephalic, without obvious abnormality, atraumatic   Eyes:  PERRL, conjunctiva/corneas clear, EOM's intact, fundi benign   Ears:  Normal TM's and external ear canals   Nose:  Nares normal, mucosa normal, no drainage or sinus tenderness   Throat:   Lips, mucosa, and tongue normal; teeth and gums normal   Neck:  Supple, no lymphadenopathy; thyroid: no enlargement/tenderness/nodules; no carotid bruit or JVD. Spine nontender.   Back:  Spine nontender, no curvature, ROM normal, no CVA tenderness   Lungs:  Clear to auscultation bilaterally without wheezes, rales or ronchi; respirations unlabored   Chest Wall:  No tenderness or deformity   Heart:  Regular rate and rhythm, S1 and S2 normal, no murmur, rub or gallop   Breast Exam:  Deferred to GYN   Abdomen:  Soft, non-tender, nondistended, normoactive bowel sounds,  no masses, no hepatosplenomegaly   Genitalia:  Deferred to GYN      Extremities:  No clubbing, cyanosis or edema. Slight discomfort noted with internal rotation of left shoulder, otherwise normal  Pulses:  2+ and symmetric all extremities   Skin:  Skin color, texture, turgor normal, no rashes or lesions. Hyperpigmented area to the right upper arm, flat, no induration, nontender.  Lymph nodes:  Cervical, supraclavicular, and axillary nodes normal   Neurologic:  CNII-XII intact, normal strength, sensation and gait; reflexes 2+ and symmetric throughout     Psych:  Normal mood, affect, hygiene and grooming   ASSESSMENT/PLAN:  Annual physical exam - Plan: POCT Urinalysis DIP (Proadvantage Device), Comprehensive metabolic panel, CBC with Differential/Platelet  Need for influenza vaccination - Plan: Flu Vaccine QUAD 6+ mos PF IM (Fluarix Quad PF)  Adjustment disorder with anxious mood - very mild, related to husband's diagnosis of cancer and kidney failure. Counseled re: self care, resources, consider counseling through [facility]  Elevated blood-pressure reading without diagnosis of hypertension - likely related to stress/anxiety; counseled re: low sodium diet, exercise, monitor at home; send in values within the month and f/u if consistently elevated   Discussed  monthly self breast exams and yearly mammograms; at least 30 minutes of aerobic activity at least 5 days/week, weight-bearing exercise 2-3x/wk; proper sunscreen use reviewed; healthy diet, including goals of calcium and vitamin D intake and alcohol recommendations (less than or equal to 1 drink/day) reviewed; regular seatbelt use; changing batteries in smoke detectors. Immunization recommendations discussed--flu shot given today. Shingrix recommended, risks/side effects reviewed. To check insurance and return for [place]. Colonoscopy was due in June 2015--to call Dr. Collene Mares  to schedule. Encouraged routine dental cleanings     Schedule a follow-up appointment with Dr. Collene Mares so that you can get your colonoscopy. Please schedule a cleaning with your dentist.  Your blood pressure was up today. I suspect this is related to stress. Limiting the sodium in your diet, getting daily exercise, and working on stress reduction can help keep the blood pressure down. Periodically monitor it at home and send [place] your values within the next month.  If it is consistently >135-140/85-90 then treatment may be indicated.

## 2018-06-07 ENCOUNTER — Ambulatory Visit (INDEPENDENT_AMBULATORY_CARE_PROVIDER_SITE_OTHER): Payer: 59 | Admitting: Family Medicine

## 2018-06-07 ENCOUNTER — Encounter: Payer: Self-pay | Admitting: Family Medicine

## 2018-06-07 VITALS — BP 146/78 | HR 100 | Ht 59.0 in | Wt 171.2 lb

## 2018-06-07 DIAGNOSIS — Z0000 Encounter for general adult medical examination without abnormal findings: Secondary | ICD-10-CM | POA: Diagnosis not present

## 2018-06-07 DIAGNOSIS — R030 Elevated blood-pressure reading, without diagnosis of hypertension: Secondary | ICD-10-CM | POA: Diagnosis not present

## 2018-06-07 DIAGNOSIS — Z23 Encounter for immunization: Secondary | ICD-10-CM | POA: Diagnosis not present

## 2018-06-07 DIAGNOSIS — F4322 Adjustment disorder with anxiety: Secondary | ICD-10-CM | POA: Diagnosis not present

## 2018-06-07 LAB — POCT URINALYSIS DIP (PROADVANTAGE DEVICE)
BILIRUBIN UA: NEGATIVE
Blood, UA: NEGATIVE
GLUCOSE UA: NEGATIVE mg/dL
Ketones, POC UA: NEGATIVE mg/dL
Leukocytes, UA: NEGATIVE
NITRITE UA: NEGATIVE
Protein Ur, POC: NEGATIVE mg/dL
Specific Gravity, Urine: 1.015
UUROB: NEGATIVE
pH, UA: 6 (ref 5.0–8.0)

## 2018-06-07 NOTE — Patient Instructions (Addendum)
HEALTH MAINTENANCE RECOMMENDATIONS:  It is recommended that you get at least 30 minutes of aerobic exercise at least 5 days/week (for weight loss, you may need as much as 60-90 minutes). This can be any activity that gets your heart rate up. This can be divided in 10-15 minute intervals if needed, but try and build up your endurance at least once a week.  Weight bearing exercise is also recommended twice weekly.  Eat a healthy diet with lots of vegetables, fruits and fiber.  "Colorful" foods have a lot of vitamins (ie green vegetables, tomatoes, red peppers, etc).  Limit sweet tea, regular sodas and alcoholic beverages, all of which has a lot of calories and sugar.  Up to 1 alcoholic drink daily may be beneficial for women (unless trying to lose weight, watch sugars).  Drink a lot of water.  Calcium recommendations are 1200-1500 mg daily (1500 mg for postmenopausal women or women without ovaries), and vitamin D 1000 IU daily.  This should be obtained from diet and/or supplements (vitamins), and calcium should not be taken all at once, but in divided doses.  Monthly self breast exams and yearly mammograms for women over the age of 73 is recommended.  Sunscreen of at least SPF 30 should be used on all sun-exposed parts of the skin when outside between the hours of 10 am and 4 pm (not just when at beach or pool, but even with exercise, golf, tennis, and yard work!)  Use a sunscreen that says "broad spectrum" so it covers both UVA and UVB rays, and make sure to reapply every 1-2 hours.  Remember to change the batteries in your smoke detectors when changing your clock times in the spring and fall.  Carbon monoxide detectors are recommended for your home.  Use your seat belt every time you are in a car, and please drive safely and not be distracted with cell phones and texting while driving.  I recommend getting the new shingles vaccine (Shingrix). You will need to check with your insurance to see if it  is covered, and schedule a nurse visit at your convenience. It is a series of 2 injections, spaced 2 months apart.  Consider counseling (look into employee assistance program through your job). Use exercise to help with stress reduction, and your overall health.   Schedule a follow-up appointment with Dr. Collene Mares so that you can get your colonoscopy.  Please schedule a cleaning with your dentist.   Your blood pressure was up today. I suspect this is related to stress. Limiting the sodium in your diet, getting daily exercise, and working on stress reduction can help keep the blood pressure down. Periodically monitor it at home and send [place] your values within the next month.  If it is consistently >135-140/85-90 then treatment may be indicated.    Low-Sodium Eating Plan Sodium, which is an element that makes up salt, helps you maintain a healthy balance of fluids in your body. Too much sodium can increase your blood pressure and cause fluid and waste to be held in your body. Your health care provider or dietitian may recommend following this plan if you have high blood pressure (hypertension), kidney disease, liver disease, or heart failure. Eating less sodium can help lower your blood pressure, reduce swelling, and protect your heart, liver, and kidneys. What are tips for following this plan? General guidelines  Most people on this plan should limit their sodium intake to 1,500-2,000 mg (milligrams) of sodium each day. Reading food labels  The Nutrition Facts label lists the amount of sodium in one serving of the food. If you eat more than one serving, you must multiply the listed amount of sodium by the number of servings.  Choose foods with less than 140 mg of sodium per serving.  Avoid foods with 300 mg of sodium or more per serving. Shopping  Look for lower-sodium products, often labeled as "low-sodium" or "no salt added."  Always check the sodium content even if foods are labeled as  "unsalted" or "no salt added".  Buy fresh foods. ? Avoid canned foods and premade or frozen meals. ? Avoid canned, cured, or processed meats  Buy breads that have less than 80 mg of sodium per slice. Cooking  Eat more home-cooked food and less restaurant, buffet, and fast food.  Avoid adding salt when cooking. Use salt-free seasonings or herbs instead of table salt or sea salt. Check with your health care provider or pharmacist before using salt substitutes.  Cook with plant-based oils, such as canola, sunflower, or olive oil. Meal planning  When eating at a restaurant, ask that your food be prepared with less salt or no salt, if possible.  Avoid foods that contain MSG (monosodium glutamate). MSG is sometimes added to [place] food, bouillon, and some canned foods. What foods are recommended? The items listed may not be a complete list. Talk with your dietitian about what dietary choices are best for you. Grains Low-sodium cereals, including oats, puffed wheat and rice, and shredded wheat. Low-sodium crackers. Unsalted rice. Unsalted pasta. Low-sodium bread. Whole-grain breads and whole-grain pasta. Vegetables Fresh or frozen vegetables. "No salt added" canned vegetables. "No salt added" tomato sauce and paste. Low-sodium or reduced-sodium tomato and vegetable juice. Fruits Fresh, frozen, or canned fruit. Fruit juice. Meats and other protein foods Fresh or frozen (no salt added) meat, poultry, seafood, and fish. Low-sodium canned tuna and salmon. Unsalted nuts. Dried peas, beans, and lentils without added salt. Unsalted canned beans. Eggs. Unsalted nut butters. Dairy Milk. Soy milk. Cheese that is naturally low in sodium, such as ricotta cheese, fresh mozzarella, or Swiss cheese Low-sodium or reduced-sodium cheese. Cream cheese. Yogurt. Fats and oils Unsalted butter. Unsalted margarine with no trans fat. Vegetable oils such as canola or olive oils. Seasonings and other foods Fresh  and dried herbs and spices. Salt-free seasonings. Low-sodium mustard and ketchup. Sodium-free salad dressing. Sodium-free light mayonnaise. Fresh or refrigerated horseradish. Lemon juice. Vinegar. Homemade, reduced-sodium, or low-sodium soups. Unsalted popcorn and pretzels. Low-salt or salt-free chips. What foods are not recommended? The items listed may not be a complete list. Talk with your dietitian about what dietary choices are best for you. Grains Instant hot cereals. Bread stuffing, pancake, and biscuit mixes. Croutons. Seasoned rice or pasta mixes. Noodle soup cups. Boxed or frozen macaroni and cheese. Regular salted crackers. Self-rising flour. Vegetables Sauerkraut, pickled vegetables, and relishes. Olives. [place] fries. Onion rings. Regular canned vegetables (not low-sodium or reduced-sodium). Regular canned tomato sauce and paste (not low-sodium or reduced-sodium). Regular tomato and vegetable juice (not low-sodium or reduced-sodium). Frozen vegetables in sauces. Meats and other protein foods Meat or fish that is salted, canned, smoked, spiced, or pickled. Bacon, ham, sausage, hotdogs, corned beef, chipped beef, packaged lunch meats, salt pork, jerky, pickled herring, anchovies, regular canned tuna, sardines, salted nuts. Dairy Processed cheese and cheese spreads. Cheese curds. Blue cheese. Feta cheese. String cheese. Regular cottage cheese. Buttermilk. Canned milk. Fats and oils Salted butter. Regular margarine. Ghee. Bacon fat. Seasonings and other foods  Onion salt, garlic salt, seasoned salt, table salt, and sea salt. Canned and packaged gravies. Worcestershire sauce. Tartar sauce. Barbecue sauce. Teriyaki sauce. Soy sauce, including reduced-sodium. Steak sauce. Fish sauce. Oyster sauce. Cocktail sauce. Horseradish that you find on the shelf. Regular ketchup and mustard. Meat flavorings and tenderizers. Bouillon cubes. Hot sauce and Tabasco sauce. Premade or packaged marinades. Premade  or packaged taco seasonings. Relishes. Regular salad dressings. Salsa. Potato and tortilla chips. Corn chips and puffs. Salted popcorn and pretzels. Canned or dried soups. Pizza. Frozen entrees and pot pies. Summary  Eating less sodium can help lower your blood pressure, reduce swelling, and protect your heart, liver, and kidneys.  Most people on this plan should limit their sodium intake to 1,500-2,000 mg (milligrams) of sodium each day.  Canned, boxed, and frozen foods are high in sodium. Restaurant foods, fast foods, and pizza are also very high in sodium. You also get sodium by adding salt to food.  Try to cook at home, eat more fresh fruits and vegetables, and eat less fast food, canned, processed, or prepared foods. This information is not intended to replace advice given to you by your health care provider. Make sure you discuss any questions you have with your health care provider. Document Released: 01/15/2002 Document Revised: 07/19/2016 Document Reviewed: 07/19/2016 Elsevier Interactive Patient Education  [employer].

## 2018-06-08 ENCOUNTER — Encounter: Payer: Self-pay | Admitting: Family Medicine

## 2018-06-08 LAB — COMPREHENSIVE METABOLIC PANEL
ALBUMIN: 3.9 g/dL (ref 3.5–5.5)
ALK PHOS: 54 IU/L (ref 39–117)
ALT: 21 IU/L (ref 0–32)
AST: 22 IU/L (ref 0–40)
Albumin/Globulin Ratio: 0.9 — ABNORMAL LOW (ref 1.2–2.2)
BUN / CREAT RATIO: 11 (ref 9–23)
BUN: 9 mg/dL (ref 6–24)
Bilirubin Total: 0.4 mg/dL (ref 0.0–1.2)
CALCIUM: 9.3 mg/dL (ref 8.7–10.2)
CO2: 24 mmol/L (ref 20–29)
Chloride: 103 mmol/L (ref 96–106)
Creatinine, Ser: 0.79 mg/dL (ref 0.57–1.00)
GFR calc Af Amer: 101 mL/min/{1.73_m2} (ref >59)
GFR, EST NON AFRICAN AMERICAN: 88 mL/min/{1.73_m2} (ref >59)
GLOBULIN, TOTAL: 4.5 g/dL (ref 1.5–4.5)
GLUCOSE: 81 mg/dL (ref 65–99)
Potassium: 4.1 mmol/L (ref 3.5–5.2)
SODIUM: 138 mmol/L (ref 134–144)
Total Protein: 8.4 g/dL (ref 6.0–8.5)

## 2018-06-08 LAB — CBC WITH DIFFERENTIAL/PLATELET
BASOS: 1 %
Basophils Absolute: 0.1 10*3/uL (ref 0.0–0.2)
EOS (ABSOLUTE): 0.1 10*3/uL (ref 0.0–0.4)
EOS: 1 %
Hematocrit: 39.7 % (ref 34.0–46.6)
Hemoglobin: 13 g/dL (ref 11.1–15.9)
IMMATURE GRANS (ABS): 0 10*3/uL (ref 0.0–0.1)
IMMATURE GRANULOCYTES: 0 %
Lymphocytes Absolute: 1.7 10*3/uL (ref 0.7–3.1)
Lymphs: 22 %
MCH: 26.9 pg (ref 26.6–33.0)
MCHC: 32.7 g/dL (ref 31.5–35.7)
MCV: 82 fL (ref 79–97)
MONOCYTES: 9 %
MONOS ABS: 0.7 10*3/uL (ref 0.1–0.9)
NEUTROS PCT: 67 %
Neutrophils Absolute: 5.2 10*3/uL (ref 1.4–7.0)
PLATELETS: 300 10*3/uL (ref 150–450)
RBC: 4.84 x10E6/uL (ref 3.77–5.28)
RDW: 14.1 % (ref 12.3–15.4)
WBC: 7.8 10*3/uL (ref 3.4–10.8)

## 2019-01-31 ENCOUNTER — Other Ambulatory Visit: Payer: Self-pay | Admitting: *Deleted

## 2019-01-31 ENCOUNTER — Encounter: Payer: Self-pay | Admitting: Family Medicine

## 2019-01-31 ENCOUNTER — Ambulatory Visit: Payer: 59 | Admitting: Family Medicine

## 2019-01-31 ENCOUNTER — Other Ambulatory Visit: Payer: Self-pay

## 2019-01-31 ENCOUNTER — Ambulatory Visit
Admission: RE | Admit: 2019-01-31 | Discharge: 2019-01-31 | Disposition: A | Discharge status: Home / Self Care | Payer: 59 | Source: Ambulatory Visit | Attending: Family Medicine | Admitting: Family Medicine

## 2019-01-31 VITALS — BP 150/80 | HR 100 | Temp 98.4°F | Ht 59.0 in | Wt 176.6 lb

## 2019-01-31 DIAGNOSIS — M25532 Pain in left wrist: Secondary | ICD-10-CM

## 2019-01-31 DIAGNOSIS — S62102A Fracture of unspecified carpal bone, left wrist, initial encounter for closed fracture: Secondary | ICD-10-CM

## 2019-01-31 NOTE — Progress Notes (Signed)
Chief Complaint  Patient presents with  . Wrist Injury    was walking dog ([employer]) and he took off, she fell-landed on hand and wrist is now hurting.(left)   This morning, while walking dog (8:15-30), he saw rabbits, pulled her and she fell backwards, onto outstretched left hand.  No other injuries. She noticed swelling of the wrist immediately.  She iced it. Pain is most at the dorsom and the radial aspect of the left wrist. She can move all her fingers, denies numbness/tingling.  She had some numbness initially, but was short-lived. Denies any pain at her elbow  PMH, PSH, SH reviewed  Outpatient Encounter Medications as of 01/31/2019  Medication Sig Note  . Ascorbic Acid (VITAMIN C PO) Take by mouth. Reported on 10/13/2015 11/28/2014: Uses occasionally--takes vitamin C or drinks juice  . levonorgestrel-ethinyl estradiol (SEASONALE,INTROVALE,JOLESSA) 0.15-0.03 MG tablet Take 1 tablet by mouth daily.    . Multiple Vitamins-Minerals (MULTI-VITAMIN GUMMIES PO) Take 1 each by mouth daily.     [name] Kitchen acetaminophen (TYLENOL) 500 MG tablet Take 500 mg by mouth every 6 (six) hours as needed.   [name] Kitchen albuterol (PROVENTIL HFA;VENTOLIN HFA) 108 (90 Base) MCG/ACT inhaler Inhale 2 puffs into the lungs every 6 (six) hours as needed for wheezing or shortness of breath. You can use 15 mins prior to exercise also (Patient not taking: Reported on 06/01/2017)   . Esomeprazole Magnesium (NEXIUM 24HR PO) Take 1 capsule by mouth daily. Reported on 12/24/2015 12/24/2015: Uses prn  . triamcinolone (NASACORT) 55 MCG/ACT AERO nasal inhaler Place 2 sprays into the nose daily. (Patient not taking: Reported on 06/01/2017)   . [DISCONTINUED] GuaiFENesin (MUCINEX PO) Take 1 tablet by mouth every 4 (four) hours as needed. 09/02/2016: Using liquid form, with decongestant and cough suppressant, 4 hour version   No facility-administered encounter medications on file as of 01/31/2019.    No Known Allergies  ROS: no fever, chills,  cough, chest pain, shortness of breath or GI complaints.  L wrist pain per HPI.  PHYSICAL EXAM: BP (!) 150/80   Pulse 100   Temp 98.4 F (36.9 C) (Temporal)   Ht 4\' 11"  (1.499 m)   Wt 176 lb 9.6 oz (80.1 kg)   LMP 11/22/2018 (Approximate)   BMI 35.67 kg/m    Wt Readings from Last 3 Encounters:  01/31/19 176 lb 9.6 oz (80.1 kg)  06/07/18 171 lb 3.2 oz (77.7 kg)  06/01/17 173 lb (78.5 kg)    Soft tissue swelling noted at L wrist. She is tender along the distal left radius. There is soft tissue swelling and increased tenderness over the dorsal wrist, next to the radius. She has limited extension (due to pan). She can flex the wrist, with discomfort and decreased ROM, but can hardly extend the wrist at all.  2+ pulses, brisk capillary refill  ASSESSMENT/PLAN:  Acute pain of left wrist - Plan: DG Wrist Complete Left   Go to [facility] imaging for x-rays of the left wrist. This is to rule out fracture (vs sprain). If there is a fracture, we will get you in with an orthopedist. If x-ray is normal, then compression/immobilization with ace wrap and/or wrist brace. Continue icing, and you may use anti-inflammatory such as motrin or aleve as needed for pain. You may continue to use tylenol in addition, if needed for pain control  ADDENDUM:   IMPRESSION: 1. Acute nondisplaced fracture of the distal radial metaphysis.  Pt advised of results and will refer to ortho (she prefers  [facility] ([facility]

## 2019-01-31 NOTE — Patient Instructions (Addendum)
Go to [facility] imaging ([address]) for x-rays of the left wrist. This is to rule out fracture (vs sprain). If there is a fracture, we will get you in with an orthopedist. If x-ray is normal, then compression/immobilization with ace wrap and/or wrist brace. Continue icing, and you may use anti-inflammatory such as motrin or aleve as needed for pain. You may continue to use tylenol in addition, if needed for pain control

## 2019-05-21 LAB — HM MAMMOGRAPHY

## 2019-05-23 ENCOUNTER — Telehealth: Payer: Self-pay | Admitting: Family Medicine

## 2019-05-23 NOTE — Telephone Encounter (Signed)
Requested records received from [facility] °

## 2019-05-24 NOTE — Progress Notes (Unsigned)
Pt.advised of mammo results AC10/15/20

## 2019-06-07 ENCOUNTER — Encounter: Payer: Self-pay | Admitting: *Deleted

## 2019-06-14 ENCOUNTER — Encounter: Payer: 59 | Admitting: Family Medicine

## 2019-06-16 NOTE — Progress Notes (Signed)
Chief Complaint  Patient presents with  . Annual Exam    fasting annual exam, no pap-sees [facility] (Dr.Horvath) and is UTD. No new concerns.     Amy Hill is a 53 y.o. female who presents for a complete physical.  She has the following concerns:  Seen for L wrist fracture in June. She was splinted by ortho, and is much better.  Pain with only certain positions or lifting something heavy.  Husband has stage 4 prostate cancer, treated with Lupron injections q6 mos (not a candidate for chemo or radiation due to his kidney disease, per pt). Tumors caused hydronephrosis, has stents. He continues to have a lot of back pain. Kidney function got worse, and he started dialysis last week.  She is constantly on the go, handling everything okay.  Denies depression, but found herself irritable/short with her daughter the other day and apologized to her.  She has known fibroid uterus. She is under the care of Dr. Philis Pique (Dr. Deatra Ina retired). She continues on Seasonale, only every 3 months, no breakthrough (rare spotting).  Cycles are much better--not as heavy, "a whole lot better".  Allergies--symptoms aresporadic, sometimes in spring, sometimes in Fall. Always has some postnasal drainage, otherwise doing wel currently. Nasacort is effective when symptomatic, not needing currently.  GERD: She is not currently taking any medication. Uses Nexium prn, only about once every 2-3 months.   Immunization History  Administered Date(s) Administered  . Influenza,inj,Quad PF,6+ Mos 06/01/2017, 06/07/2018  . Td 04/09/2002  . Tdap 01/21/2011   Last Pap smear: through GYN,03/2016 normal pap, negative HPV. Saw GYN in 05/2019 Last mammogram:05/2019 Last colonoscopy: 01/2009, Dr. Collene Mares; due again 01/2014 but didn't have. She called again in Sept/Oct to schedule, and found out she owes money, so will schedule when paid off. Last DEXA: never Ophtho: yearly Dentist: about5-6years ago Exercise:Not  much recently.  Walks the dog twice a day (slow), 10 minutes Vitamin D level:42in5/2017 Lipids: Lab Results  Component Value Date   CHOL 184 06/01/2017   HDL 61 06/01/2017   LDLCALC 108 (H) 06/01/2017   TRIG 67 06/01/2017   CHOLHDL 3.0 06/01/2017   Past Medical History:  Diagnosis Date  . Adenomatous colon polyp 01/2009  . Allergy   . Contraceptive management   . Frequent UTI   . GERD (gastroesophageal reflux disease)   . Migraine   . Uterine fibroid    Dr. Deatra Ina    Past Surgical History:  Procedure Laterality Date  . CESAREAN SECTION    . COLONOSCOPY  01/10/2009   tubular adenoma (Dr. Collene Mares); repeat every 5 years    Social History   Socioeconomic History  . Marital status: Married    Spouse name: Not on file  . Number of children: 1  . Years of education: Not on file  . Highest education level: Not on file  Occupational History  . Occupation: Product manager: [employer]  Social Needs  . Financial resource strain: Not on file  . Food insecurity    Worry: Not on file    Inability: Not on file  . Transportation needs    Medical: Not on file    Non-medical: Not on file  Tobacco Use  . Smoking status: Never Smoker  . Smokeless tobacco: Never Used  Substance and Sexual Activity  . Alcohol use: Yes    Comment: 1/week or less  . Drug use: No  . Sexual activity: Yes    Partners: Male  Birth control/protection: Pill  Lifestyle  . Physical activity    Days per week: Not on file    Minutes per session: Not on file  . Stress: Not on file  Relationships  . Social Herbalist on phone: Not on file    Gets together: Not on file    Attends religious service: Not on file    Active member of club or organization: Not on file    Attends meetings of clubs or organizations: Not on file    Relationship status: Not on file  . Intimate partner violence    Fear of current or ex partner: Not on file    Emotionally abused: Not on  file    Physically abused: Not on file    Forced sexual activity: Not on file  Other Topics Concern  . Not on file  Social History Narrative   Lives with husband (used to be out of town a lot, Administrator) and 1 daughter. Got a [place] 2018. 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   Husband diagnosed with stage 4 prostate cancer and kidney disease 12/2017.   06/2019 husband on dialysis.    Family History  Problem Relation Age of Onset  . Hyperlipidemia Mother   . Hypertension Mother   . Colon polyps Father   . Hyperlipidemia Father   . Hypertension Father   . Gout Father   . Heart disease Maternal Grandfather   . Cancer Neg Hx   . Diabetes Neg Hx     Outpatient Encounter Medications as of 06/18/2019  Medication Sig Note  . Ascorbic Acid (VITAMIN C PO) Take by mouth. Reported on 10/13/2015   . levonorgestrel-ethinyl estradiol (SEASONALE,INTROVALE,JOLESSA) 0.15-0.03 MG tablet Take 1 tablet by mouth daily.    . Multiple Vitamins-Minerals (MULTI-VITAMIN GUMMIES PO) Take 1 each by mouth daily.     [name] Kitchen acetaminophen (TYLENOL) 500 MG tablet Take 500 mg by mouth every 6 (six) hours as needed.   [name] Kitchen albuterol (PROVENTIL HFA;VENTOLIN HFA) 108 (90 Base) MCG/ACT inhaler Inhale 2 puffs into the lungs every 6 (six) hours as needed for wheezing or shortness of breath. You can use 15 mins prior to exercise also (Patient not taking: Reported on 06/18/2019)   . Esomeprazole Magnesium (NEXIUM 24HR PO) Take 1 capsule by mouth daily. Reported on 12/24/2015 12/24/2015: Uses prn  . triamcinolone (NASACORT) 55 MCG/ACT AERO nasal inhaler Place 2 sprays into the nose daily. (Patient not taking: Reported on 06/01/2017)    No facility-administered encounter medications on file as of 06/18/2019.     No Known Allergies  ROS: The patient denies anorexia, fever, headaches, vision changes, decreased hearing, ear pain, sore throat, breast concerns, chest pain,palpitations,  dizziness, syncope, cough, swelling, nausea, vomiting, diarrhea, constipation, abdominal pain, melena, hematochezia, hematuria, incontinence, dysuria, irregular menstrual cycles, vaginal discharge, odor or itch, genital lesions, joint pains, numbness, tingling, weakness, tremor, suspicious skin lesions, depression, abnormal bleeding/bruising, or enlarged lymph nodes. Allergies are controlled with prn medications. Left wrist pain only with certain movements/positions (s/p fracture) Stress related to her husband's declining health Weight gain noted    PHYSICAL EXAM:  BP (!) 150/80   Pulse 92   Temp 98.2 F (36.8 C) (Other (Comment))   Ht 4\' 11"  (1.499 m)   Wt 179 lb (81.2 kg)   BMI 36.15 kg/m   Wt Readings from Last 3 Encounters:  06/18/19 179 lb (81.2 kg)  01/31/19 176 lb 9.6 oz (80.1 kg)  06/07/18 171 lb 3.2 oz (77.7 kg)   146/78 on repeat by MD  General Appearance:  Alert, cooperative, no distress, appears stated age.She clears her throat fairly frequently during visit.  Head:  Normocephalic, without obvious abnormality, atraumatic   Eyes:  PERRL, conjunctiva/corneas clear, EOM's intact, fundi benign   Ears:  Normal TM's and external ear canals   Nose:  Nasal mucosa is moderately edematous, no erythema or purulence. Sinuses are nontender  Throat:  OP is clear without lesions.  Neck:  Supple, no lymphadenopathy; thyroid: no enlargement/tenderness/nodules; no carotid bruit or JVD. Spine nontender.   Back:  Spine nontender, no curvature, ROM normal, no CVA tenderness   Lungs:  Clear to auscultation bilaterally without wheezes, rales or ronchi; respirations unlabored   Chest Wall:  No tenderness or deformity   Heart:  Regular rate and rhythm, S1 and S2 normal, no murmur, rub or gallop   Breast Exam:  Deferred to GYN   Abdomen:  Soft, non-tender, nondistended, normoactive bowel sounds,  no masses, no hepatosplenomegaly   Genitalia:   Deferred to GYN      Extremities:  No clubbing, cyanosis or edema.L wrist appears normal  Pulses:  2+ and symmetric all extremities   Skin:  Skin color, texture, turgor normal, no rashes or lesions. Has a hyperpigmented spot at right medial upper arm (site of prior tick bite).  She also has an area of linear hyperpigmentation (scar) next to a benign-appearing more a little more laterally. Hyperpigmentation at right lateral lower leg prior trauma) Birthmark at left anterior lower leg  Lymph nodes:  Cervical, supraclavicular, and axillary nodes normal   Neurologic:  CNII-XII intact, normal strength, sensation and gait; reflexes 2+ and symmetric throughout    Psych: Normal mood, affect, hygiene and grooming  PHQ-9 score of 0  ASSESSMENT/PLAN:  Annual physical exam - Plan: Lipid panel, Comprehensive metabolic panel, CBC with Differential, TSH  Blood pressure elevated without history of HTN - +stressors.  Reviewed low sodium diet, need for weight loss and daily exercise. Has BP  monitor--to check regularly and f/u if remains elevated  Weight gain - reviewed healthy diet, portion control, regular exercise. - Plan: TSH  Fatigue, unspecified type - Plan: Comprehensive metabolic panel, CBC with Differential, TSH  Need for influenza vaccination - Plan: Flu Vaccine QUAD 6+ mos PF IM (Fluarix Quad PF)  BMI 36.0-36.9,adult - with elevated BP today.  counseled re: risks, about healthy diet, portions, exercise and wt loss  H/O adenomatous polyp of colon - past due for f/u colonoscopy.  Pt aware, and working on it (to pay off balance and schedule)   Discussed monthly self breast exams and yearly mammograms; at least 30 minutes of aerobic activity at least 5 days/week, weight-bearing exercise 2-3x/wk; proper sunscreen use reviewed; healthy diet, including goals of calcium and vitamin D intake and alcohol recommendations (less than or equal to 1 drink/day)  reviewed; regular seatbelt use; changing batteries in smoke detectors. Immunization recommendations discussed--flu shot given today.Shingrix recommended, risks/side effects reviewed. To check insurance and return for NV.Colonoscopy was due in June 2015--she now knows that she has a $500 balance she will have to pay off before she can schedule her next colonoscopy. She plans to do this (and/or contact us for referral elsewhere if that proves very difficult for her).  Encouraged routine dental cleanings.  Discussed low sodium diet, stress reduction, weight loss and exercise. Discussed stressors, and how it can affect her as well as her daughter, and to consider  counseling.  Given Margart Sickles name (especially for daughter, but could be for either) at [facility].  F/u 1 year, sooner if BP's remain elevated at home. Discussed possible NV to have her BP monitor's accuracy assessed.

## 2019-06-16 NOTE — Patient Instructions (Addendum)
HEALTH MAINTENANCE RECOMMENDATIONS:  It is recommended that you get at least 30 minutes of aerobic exercise at least 5 days/week (for weight loss, you may need as much as 60-90 minutes). This can be any activity that gets your heart rate up. This can be divided in 10-15 minute intervals if needed, but try and build up your endurance at least once a week.  Weight bearing exercise is also recommended twice weekly.  Eat a healthy diet with lots of vegetables, fruits and fiber.  "Colorful" foods have a lot of vitamins (ie green vegetables, tomatoes, red peppers, etc).  Limit sweet tea, regular sodas and alcoholic beverages, all of which has a lot of calories and sugar.  Up to 1 alcoholic drink daily may be beneficial for women (unless trying to lose weight, watch sugars).  Drink a lot of water.  Calcium recommendations are 1200-1500 mg daily (1500 mg for postmenopausal women or women without ovaries), and vitamin D 1000 IU daily.  This should be obtained from diet and/or supplements (vitamins), and calcium should not be taken all at once, but in divided doses.  Monthly self breast exams and yearly mammograms for women over the age of 53 is recommended.  Sunscreen of at least SPF 30 should be used on all sun-exposed parts of the skin when outside between the hours of 10 am and 4 pm (not just when at beach or pool, but even with exercise, golf, tennis, and yard work!)  Use a sunscreen that says "broad spectrum" so it covers both UVA and UVB rays, and make sure to reapply every 1-2 hours.  Remember to change the batteries in your smoke detectors when changing your clock times in the spring and fall.  Use your seat belt every time you are in a car, and please drive safely and not be distracted with cell phones and texting while driving.  I recommend getting the new shingles vaccine (Shingrix). You will need to check with your insurance to see if it is covered, and if covered, schedule a nurse visit at our  office when convenient.  It is a series of 2 injections, spaced 2 months apart.  Laroy Apple at [facility] is the therapist we discussed.  Please remember to schedule your colonoscopy when you are able to.  Please schedule a dental cleaning/evaluation.  Your blood pressure was elevated today.  Cutting back on sodium in the diet, daily exercise and weight loss will all help keep the blood pressure down.  Periodically check your blood pressure at home. Goals is <13/80 (up to 135/85 is borderline).  Send me your blood pressures within the next month. You can feel free to schedule a nurse visit to have the accuracy of your monitor checked if you want. If blood pressures remain over 140/90 after 1-2 months of working at it on your own, please schedule a visit.    MyPlate from [place] is an outline of a general healthy diet based on the 2010 Dietary Guidelines for Americans, from the U.S. Department of Agriculture Scientist, research (physical sciences)). It sets guidelines for how much food you should eat from each food group based on your age, sex, and level of physical activity. What are tips for following MyPlate? To follow MyPlate recommendations:  Eat a wide variety of fruits and vegetables, grains, and protein foods.  Serve smaller portions and eat less food throughout the day.  Limit portion sizes to avoid overeating.  Enjoy your food.  Get at least 150 minutes of  exercise every week. This is about 30 minutes each day, 5 or more days per week. It can be difficult to have every meal look like MyPlate. Think about MyPlate as eating guidelines for an entire day, rather than each individual meal. Fruits and vegetables  Make half of your plate fruits and vegetables.  Eat many different colors of fruits and vegetables each day.  For a 2,000 calorie daily food plan, eat: ? 2 cups of vegetables every day. ? 2 cups of fruit every day.  1 cup is equal to: ? 1 cup raw or cooked vegetables. ? 1 cup raw  fruit. ? 1 medium-sized orange, apple, or banana. ? 1 cup 100% fruit or vegetable juice. ? 2 cups raw leafy greens, such as lettuce, spinach, or kale. ?  cup dried fruit. Grains  One fourth of your plate should be grains.  Make at least half of the grains you eat each day whole grains.  For a 2,000 calorie daily food plan, eat 6 oz of grains every day.  1 oz is equal to: ? 1 slice bread. ? 1 cup cereal. ?  cup cooked rice, cereal, or pasta. Protein  One fourth of your plate should be protein.  Eat a wide variety of protein foods, including meat, poultry, fish, eggs, beans, nuts, and tofu.  For a 2,000 calorie daily food plan, eat 5 oz of protein every day.  1 oz is equal to: ? 1 oz meat, poultry, or fish. ?  cup cooked beans. ? 1 egg. ?  oz nuts or seeds. ? 1 Tbsp peanut butter. Dairy  Drink fat-free or low-fat (1%) milk.  Eat or drink dairy as a side to meals.  For a 2,000 calorie daily food plan, eat or drink 3 cups of dairy every day.  1 cup is equal to: ? 1 cup milk, yogurt, cottage cheese, or soy milk (soy beverage). ? 2 oz processed cheese. ? 1 oz natural cheese. Fats, oils, salt, and sugars  Only small amounts of oils are recommended.  Avoid foods that are high in calories and low in nutritional value (empty calories), like foods high in fat or added sugars.  Choose foods that are low in salt (sodium). Choose foods that have less than 140 milligrams (mg) of sodium per serving.  Drink water instead of sugary drinks. Drink enough water each day to keep your urine pale yellow. Where to find support  Work with your health care provider or a nutrition specialist (dietitian) to develop a customized eating plan that is right for you.  Download an app (mobile application) to help you track your daily food intake. Where to find more information  Go to [email or web address] for more information.  Learn more and log your daily food intake according to  MyPlate using USDA's SuperTracker: www.supertracker.usda.gov Summary  MyPlate is a general guideline for healthy eating from the USDA. It is based on the 2010 Dietary Guidelines for Americans.  In general, fruits and vegetables should take up  of your plate, grains should take up  of your plate, and protein should take up  of your plate. This information is not intended to replace advice given to you by your health care provider. Make sure you discuss any questions you have with your health care provider. Document Released: 08/15/2007 Document Revised: 08/27/2017 Document Reviewed: 10/25/2016 Elsevier Patient Education  [place].   Low-Sodium Eating Plan Sodium, which is an element that makes up salt, helps you maintain a healthy  balance of fluids in your body. Too much sodium can increase your blood pressure and cause fluid and waste to be held in your body. Your health care provider or dietitian may recommend following this plan if you have high blood pressure (hypertension), kidney disease, liver disease, or heart failure. Eating less sodium can help lower your blood pressure, reduce swelling, and protect your heart, liver, and kidneys. What are tips for following this plan? General guidelines  Most people on this plan should limit their sodium intake to 1,500-2,000 mg (milligrams) of sodium each day. Reading food labels   The Nutrition Facts label lists the amount of sodium in one serving of the food. If you eat more than one serving, you must multiply the listed amount of sodium by the number of servings.  Choose foods with less than 140 mg of sodium per serving.  Avoid foods with 300 mg of sodium or more per serving. Shopping  Look for lower-sodium products, often labeled as "low-sodium" or "no salt added."  Always check the sodium content even if foods are labeled as "unsalted" or "no salt added".  Buy fresh foods. ? Avoid canned foods and premade or frozen meals.  ? Avoid canned, cured, or processed meats  Buy breads that have less than 80 mg of sodium per slice. Cooking  Eat more home-cooked food and less restaurant, buffet, and fast food.  Avoid adding salt when cooking. Use salt-free seasonings or herbs instead of table salt or sea salt. Check with your health care provider or pharmacist before using salt substitutes.  Cook with plant-based oils, such as canola, sunflower, or olive oil. Meal planning  When eating at a restaurant, ask that your food be prepared with less salt or no salt, if possible.  Avoid foods that contain MSG (monosodium glutamate). MSG is sometimes added to [place] food, bouillon, and some canned foods. What foods are recommended? The items listed may not be a complete list. Talk with your dietitian about what dietary choices are best for you. Grains Low-sodium cereals, including oats, puffed wheat and rice, and shredded wheat. Low-sodium crackers. Unsalted rice. Unsalted pasta. Low-sodium bread. Whole-grain breads and whole-grain pasta. Vegetables Fresh or frozen vegetables. "No salt added" canned vegetables. "No salt added" tomato sauce and paste. Low-sodium or reduced-sodium tomato and vegetable juice. Fruits Fresh, frozen, or canned fruit. Fruit juice. Meats and other protein foods Fresh or frozen (no salt added) meat, poultry, seafood, and fish. Low-sodium canned tuna and salmon. Unsalted nuts. Dried peas, beans, and lentils without added salt. Unsalted canned beans. Eggs. Unsalted nut butters. Dairy Milk. Soy milk. Cheese that is naturally low in sodium, such as ricotta cheese, fresh mozzarella, or Swiss cheese Low-sodium or reduced-sodium cheese. Cream cheese. Yogurt. Fats and oils Unsalted butter. Unsalted margarine with no trans fat. Vegetable oils such as canola or olive oils. Seasonings and other foods Fresh and dried herbs and spices. Salt-free seasonings. Low-sodium mustard and ketchup. Sodium-free salad  dressing. Sodium-free light mayonnaise. Fresh or refrigerated horseradish. Lemon juice. Vinegar. Homemade, reduced-sodium, or low-sodium soups. Unsalted popcorn and pretzels. Low-salt or salt-free chips. What foods are not recommended? The items listed may not be a complete list. Talk with your dietitian about what dietary choices are best for you. Grains Instant hot cereals. Bread stuffing, pancake, and biscuit mixes. Croutons. Seasoned rice or pasta mixes. Noodle soup cups. Boxed or frozen macaroni and cheese. Regular salted crackers. Self-rising flour. Vegetables Sauerkraut, pickled vegetables, and relishes. Olives. [place] fries. Onion rings. Regular canned  vegetables (not low-sodium or reduced-sodium). Regular canned tomato sauce and paste (not low-sodium or reduced-sodium). Regular tomato and vegetable juice (not low-sodium or reduced-sodium). Frozen vegetables in sauces. Meats and other protein foods Meat or fish that is salted, canned, smoked, spiced, or pickled. Bacon, ham, sausage, hotdogs, corned beef, chipped beef, packaged lunch meats, salt pork, jerky, pickled herring, anchovies, regular canned tuna, sardines, salted nuts. Dairy Processed cheese and cheese spreads. Cheese curds. Blue cheese. Feta cheese. String cheese. Regular cottage cheese. Buttermilk. Canned milk. Fats and oils Salted butter. Regular margarine. Ghee. Bacon fat. Seasonings and other foods Onion salt, garlic salt, seasoned salt, table salt, and sea salt. Canned and packaged gravies. Worcestershire sauce. Tartar sauce. Barbecue sauce. Teriyaki sauce. Soy sauce, including reduced-sodium. Steak sauce. Fish sauce. Oyster sauce. Cocktail sauce. Horseradish that you find on the shelf. Regular ketchup and mustard. Meat flavorings and tenderizers. Bouillon cubes. Hot sauce and Tabasco sauce. Premade or packaged marinades. Premade or packaged taco seasonings. Relishes. Regular salad dressings. Salsa. Potato and tortilla chips.  Corn chips and puffs. Salted popcorn and pretzels. Canned or dried soups. Pizza. Frozen entrees and pot pies. Summary  Eating less sodium can help lower your blood pressure, reduce swelling, and protect your heart, liver, and kidneys.  Most people on this plan should limit their sodium intake to 1,500-2,000 mg (milligrams) of sodium each day.  Canned, boxed, and frozen foods are high in sodium. Restaurant foods, fast foods, and pizza are also very high in sodium. You also get sodium by adding salt to food.  Try to cook at home, eat more fresh fruits and vegetables, and eat less fast food, canned, processed, or prepared foods. This information is not intended to replace advice given to you by your health care provider. Make sure you discuss any questions you have with your health care provider. Document Released: 01/15/2002 Document Revised: 07/08/2017 Document Reviewed: 07/19/2016 Elsevier Patient Education  2020 [employer].

## 2019-06-18 ENCOUNTER — Encounter: Payer: Self-pay | Admitting: Family Medicine

## 2019-06-18 ENCOUNTER — Ambulatory Visit: Payer: 59 | Admitting: Family Medicine

## 2019-06-18 ENCOUNTER — Other Ambulatory Visit: Payer: Self-pay

## 2019-06-18 VITALS — BP 150/80 | HR 92 | Temp 98.2°F | Ht 59.0 in | Wt 179.0 lb

## 2019-06-18 DIAGNOSIS — Z0000 Encounter for general adult medical examination without abnormal findings: Secondary | ICD-10-CM | POA: Diagnosis not present

## 2019-06-18 DIAGNOSIS — R5383 Other fatigue: Secondary | ICD-10-CM | POA: Diagnosis not present

## 2019-06-18 DIAGNOSIS — R635 Abnormal weight gain: Secondary | ICD-10-CM

## 2019-06-18 DIAGNOSIS — R030 Elevated blood-pressure reading, without diagnosis of hypertension: Secondary | ICD-10-CM | POA: Diagnosis not present

## 2019-06-18 DIAGNOSIS — Z6836 Body mass index (BMI) 36.0-36.9, adult: Secondary | ICD-10-CM

## 2019-06-18 DIAGNOSIS — Z23 Encounter for immunization: Secondary | ICD-10-CM

## 2019-06-18 DIAGNOSIS — Z86010 Personal history of colonic polyps: Secondary | ICD-10-CM

## 2019-06-19 LAB — COMPREHENSIVE METABOLIC PANEL
ALT: 28 IU/L (ref 0–32)
AST: 32 IU/L (ref 0–40)
Albumin/Globulin Ratio: 0.9 — ABNORMAL LOW (ref 1.2–2.2)
Albumin: 3.7 g/dL — ABNORMAL LOW (ref 3.8–4.9)
Alkaline Phosphatase: 58 IU/L (ref 39–117)
BUN/Creatinine Ratio: 15 (ref 9–23)
BUN: 11 mg/dL (ref 6–24)
Bilirubin Total: 0.4 mg/dL (ref 0.0–1.2)
CO2: 20 mmol/L (ref 20–29)
Calcium: 9 mg/dL (ref 8.7–10.2)
Chloride: 103 mmol/L (ref 96–106)
Creatinine, Ser: 0.75 mg/dL (ref 0.57–1.00)
GFR calc Af Amer: 107 mL/min/{1.73_m2} (ref >59)
GFR calc non Af Amer: 93 mL/min/{1.73_m2} (ref >59)
Globulin, Total: 4.2 g/dL (ref 1.5–4.5)
Glucose: 85 mg/dL (ref 65–99)
Potassium: 4.2 mmol/L (ref 3.5–5.2)
Sodium: 137 mmol/L (ref 134–144)
Total Protein: 7.9 g/dL (ref 6.0–8.5)

## 2019-06-19 LAB — CBC WITH DIFFERENTIAL/PLATELET
Basophils Absolute: 0.1 10*3/uL (ref 0.0–0.2)
Basos: 1 %
EOS (ABSOLUTE): 0.1 10*3/uL (ref 0.0–0.4)
Eos: 1 %
Hematocrit: 39.8 % (ref 34.0–46.6)
Hemoglobin: 13.1 g/dL (ref 11.1–15.9)
Immature Grans (Abs): 0 10*3/uL (ref 0.0–0.1)
Immature Granulocytes: 0 %
Lymphocytes Absolute: 1.8 10*3/uL (ref 0.7–3.1)
Lymphs: 23 %
MCH: 26.7 pg (ref 26.6–33.0)
MCHC: 32.9 g/dL (ref 31.5–35.7)
MCV: 81 fL (ref 79–97)
Monocytes Absolute: 0.7 10*3/uL (ref 0.1–0.9)
Monocytes: 9 %
Neutrophils Absolute: 5 10*3/uL (ref 1.4–7.0)
Neutrophils: 66 %
Platelets: 305 10*3/uL (ref 150–450)
RBC: 4.91 x10E6/uL (ref 3.77–5.28)
RDW: 13.5 % (ref 11.7–15.4)
WBC: 7.5 10*3/uL (ref 3.4–10.8)

## 2019-06-19 LAB — LIPID PANEL
Chol/HDL Ratio: 3.5 ratio (ref 0.0–4.4)
Cholesterol, Total: 176 mg/dL (ref 100–199)
HDL: 51 mg/dL (ref >39)
LDL Chol Calc (NIH): 112 mg/dL — ABNORMAL HIGH (ref 0–99)
Triglycerides: 69 mg/dL (ref 0–149)
VLDL Cholesterol Cal: 13 mg/dL (ref 5–40)

## 2019-06-19 LAB — TSH: TSH: 1.55 u[IU]/mL (ref 0.450–4.500)

## 2019-09-10 ENCOUNTER — Ambulatory Visit: Payer: 59 | Attending: Internal Medicine

## 2019-09-10 DIAGNOSIS — Z20822 Contact with and (suspected) exposure to covid-19: Secondary | ICD-10-CM | POA: Insufficient documentation

## 2019-09-11 ENCOUNTER — Other Ambulatory Visit: Payer: 59

## 2019-09-11 LAB — NOVEL CORONAVIRUS, NAA: SARS-CoV-2, NAA: NOT DETECTED

## 2019-09-12 ENCOUNTER — Telehealth: Payer: Self-pay | Admitting: Family Medicine

## 2019-09-12 NOTE — Telephone Encounter (Signed)
Patient called in and received her negative covid test result  

## 2019-10-20 ENCOUNTER — Ambulatory Visit: Payer: Self-pay | Attending: Internal Medicine

## 2019-10-20 DIAGNOSIS — Z23 Encounter for immunization: Secondary | ICD-10-CM

## 2019-10-20 NOTE — Progress Notes (Signed)
   Covid-19 Vaccination Clinic  Name:  Amy Hill    MRN: [id number] DOB: Apr 07, 1966  10/20/2019  [name] was observed post Covid-19 immunization for 15 minutes without incident. She was provided with Vaccine Information Sheet and instruction to access the V-Safe system.   [name] was instructed to call 911 with any severe reactions post vaccine: [name] Kitchen Difficulty breathing  . Swelling of face and throat  . A fast heartbeat  . A bad rash all over body  . Dizziness and weakness   Immunizations Administered    Name Date Dose VIS Date Route   Pfizer COVID-19 Vaccine 10/20/2019 11:16 AM 0.3 mL 07/20/2019 Intramuscular   Manufacturer: [place]   Lot: [id number]   [place]: [id number]

## 2019-11-12 ENCOUNTER — Ambulatory Visit: Payer: Self-pay

## 2019-11-14 ENCOUNTER — Ambulatory Visit: Payer: Self-pay | Attending: Internal Medicine

## 2019-11-14 DIAGNOSIS — Z23 Encounter for immunization: Secondary | ICD-10-CM

## 2019-11-14 NOTE — Progress Notes (Signed)
   Covid-[facility] Vaccination [facility]  Name:  Amy Hill    MRN: [id number] DOB: 09-14-1965  11/14/2019  Amy Hill was observed post Covid-19 immunization for 15 minutes without incident. She was provided with Vaccine Information Sheet and instruction to access the V-Safe system.   Amy Hill was instructed to call 911 with any severe reactions post vaccine: [name] Kitchen Difficulty breathing  . Swelling of face and throat  . A fast heartbeat  . A bad rash all over body  . Dizziness and weakness   Immunizations Administered    Name Date Dose VIS Date Route   Pfizer COVID-19 Vaccine 11/14/2019 10:24 AM 0.3 mL 07/20/2019 Intramuscular   Manufacturer: [place]   Lot: [id number]   [name]: [id number]

## 2019-12-26 ENCOUNTER — Ambulatory Visit: Payer: 59 | Admitting: Family Medicine

## 2019-12-26 ENCOUNTER — Other Ambulatory Visit: Payer: Self-pay

## 2019-12-26 ENCOUNTER — Encounter: Payer: Self-pay | Admitting: Family Medicine

## 2019-12-26 VITALS — BP 130/76 | HR 96 | Ht 59.0 in | Wt 172.4 lb

## 2019-12-26 DIAGNOSIS — J309 Allergic rhinitis, unspecified: Secondary | ICD-10-CM | POA: Diagnosis not present

## 2019-12-26 DIAGNOSIS — R030 Elevated blood-pressure reading, without diagnosis of hypertension: Secondary | ICD-10-CM

## 2019-12-26 DIAGNOSIS — Z6834 Body mass index (BMI) 34.0-34.9, adult: Secondary | ICD-10-CM

## 2019-12-26 NOTE — Patient Instructions (Addendum)
Exercise 30 minutes every day. Cut back on the chips, fried foods, and other high sodium foods as we discussed.  Low threshold to start you on a blood pressure medication if you continue to see it frequently in the upper 130's and 140's.  I'd like to give you a little time to work on diet/exercise/weight loss and further monitoring before putting you on a medication that will be long-term.  I recommend using the Nasacort every day (through the rest of the season), and use the zyrtec "as needed", rather than vice versa.    DASH Eating Plan DASH stands for "Dietary Approaches to Stop Hypertension." The DASH eating plan is a healthy eating plan that has been shown to reduce high blood pressure (hypertension). It may also reduce your risk for type 2 diabetes, heart disease, and stroke. The DASH eating plan may also help with weight loss. What are tips for following this plan?  General guidelines  Avoid eating more than 2,300 mg (milligrams) of salt (sodium) a day. If you have hypertension, you may need to reduce your sodium intake to 1,500 mg a day.  Limit alcohol intake to no more than 1 drink a day for nonpregnant women and 2 drinks a day for men. One drink equals 12 oz of beer, 5 oz of wine, or 1 oz of hard liquor.  Work with your health care provider to maintain a healthy body weight or to lose weight. Ask what an ideal weight is for you.  Get at least 30 minutes of exercise that causes your heart to beat faster (aerobic exercise) most days of the week. Activities may include walking, swimming, or biking.  Work with your health care provider or diet and nutrition specialist (dietitian) to adjust your eating plan to your individual calorie needs. Reading food labels   Check food labels for the amount of sodium per serving. Choose foods with less than 5 percent of the Daily Value of sodium. Generally, foods with less than 300 mg of sodium per serving fit into this eating plan.  To find  whole grains, look for the word "whole" as the first word in the ingredient list. Shopping  Buy products labeled as "low-sodium" or "no salt added."  Buy fresh foods. Avoid canned foods and premade or frozen meals. Cooking  Avoid adding salt when cooking. Use salt-free seasonings or herbs instead of table salt or sea salt. Check with your health care provider or pharmacist before using salt substitutes.  Do not fry foods. Cook foods using healthy methods such as baking, boiling, grilling, and broiling instead.  Cook with heart-healthy oils, such as olive, canola, soybean, or sunflower oil. Meal planning  Eat a balanced diet that includes: ? 5 or more servings of fruits and vegetables each day. At each meal, try to fill half of your plate with fruits and vegetables. ? Up to 6-8 servings of whole grains each day. ? Less than 6 oz of lean meat, poultry, or fish each day. A 3-oz serving of meat is about the same size as a deck of cards. One egg equals 1 oz. ? 2 servings of low-fat dairy each day. ? A serving of nuts, seeds, or beans 5 times each week. ? Heart-healthy fats. Healthy fats called Omega-3 fatty acids are found in foods such as flaxseeds and coldwater fish, like sardines, salmon, and mackerel.  Limit how much you eat of the following: ? Canned or prepackaged foods. ? Food that is high in trans fat, such  as fried foods. ? Food that is high in saturated fat, such as fatty meat. ? Sweets, desserts, sugary drinks, and other foods with added sugar. ? Full-fat dairy products.  Do not salt foods before eating.  Try to eat at least 2 vegetarian meals each week.  Eat more home-cooked food and less restaurant, buffet, and fast food.  When eating at a restaurant, ask that your food be prepared with less salt or no salt, if possible. What foods are recommended? The items listed may not be a complete list. Talk with your dietitian about what dietary choices are best for  you. Grains Whole-grain or whole-wheat bread. Whole-grain or whole-wheat pasta. Brown rice. [place]. Bulgur. Whole-grain and low-sodium cereals. Pita bread. Low-fat, low-sodium crackers. Whole-wheat flour tortillas. Vegetables Fresh or frozen vegetables (raw, steamed, roasted, or grilled). Low-sodium or reduced-sodium tomato and vegetable juice. Low-sodium or reduced-sodium tomato sauce and tomato paste. Low-sodium or reduced-sodium canned vegetables. Fruits All fresh, dried, or frozen fruit. Canned fruit in natural juice (without added sugar). Meat and other protein foods Skinless chicken or [place]. Ground chicken or [place]. Pork with fat trimmed off. Fish and seafood. Egg whites. Dried beans, peas, or lentils. Unsalted nuts, nut butters, and seeds. Unsalted canned beans. Lean cuts of beef with fat trimmed off. Low-sodium, lean deli meat. Dairy Low-fat (1%) or fat-free (skim) milk. Fat-free, low-fat, or reduced-fat cheeses. Nonfat, low-sodium ricotta or cottage cheese. Low-fat or nonfat yogurt. Low-fat, low-sodium cheese. Fats and oils Soft margarine without trans fats. Vegetable oil. Low-fat, reduced-fat, or light mayonnaise and salad dressings (reduced-sodium). Canola, safflower, olive, soybean, and sunflower oils. Avocado. Seasoning and other foods Herbs. Spices. Seasoning mixes without salt. Unsalted popcorn and pretzels. Fat-free sweets. What foods are not recommended? The items listed may not be a complete list. Talk with your dietitian about what dietary choices are best for you. Grains Baked goods made with fat, such as croissants, muffins, or some breads. Dry pasta or rice meal packs. Vegetables Creamed or fried vegetables. Vegetables in a cheese sauce. Regular canned vegetables (not low-sodium or reduced-sodium). Regular canned tomato sauce and paste (not low-sodium or reduced-sodium). Regular tomato and vegetable juice (not low-sodium or reduced-sodium). Angie Fava.  Olives. Fruits Canned fruit in a light or heavy syrup. Fried fruit. Fruit in cream or butter sauce. Meat and other protein foods Fatty cuts of meat. Ribs. Fried meat. Berniece Salines. Sausage. Bologna and other processed lunch meats. Salami. Fatback. Hotdogs. Bratwurst. Salted nuts and seeds. Canned beans with added salt. Canned or smoked fish. Whole eggs or egg yolks. Chicken or [place] with skin. Dairy Whole or 2% milk, cream, and half-and-half. Whole or full-fat cream cheese. Whole-fat or sweetened yogurt. Full-fat cheese. Nondairy creamers. Whipped toppings. Processed cheese and cheese spreads. Fats and oils Butter. Stick margarine. Lard. Shortening. Ghee. Bacon fat. Tropical oils, such as coconut, palm kernel, or palm oil. Seasoning and other foods Salted popcorn and pretzels. Onion salt, garlic salt, seasoned salt, table salt, and sea salt. Worcestershire sauce. Tartar sauce. Barbecue sauce. Teriyaki sauce. Soy sauce, including reduced-sodium. Steak sauce. Canned and packaged gravies. Fish sauce. Oyster sauce. Cocktail sauce. Horseradish that you find on the shelf. Ketchup. Mustard. Meat flavorings and tenderizers. Bouillon cubes. Hot sauce and Tabasco sauce. Premade or packaged marinades. Premade or packaged taco seasonings. Relishes. Regular salad dressings. Where to find more information:  National Heart, Lung, and [place]: https://wilson-eaton.com/  American Heart Association: www.heart.org Summary  The DASH eating plan is a healthy eating plan that has been shown  to reduce high blood pressure (hypertension). It may also reduce your risk for type 2 diabetes, heart disease, and stroke.  With the DASH eating plan, you should limit salt (sodium) intake to 2,300 mg a day. If you have hypertension, you may need to reduce your sodium intake to 1,500 mg a day.  When on the DASH eating plan, aim to eat more fresh fruits and vegetables, whole grains, lean proteins, low-fat dairy, and heart-healthy  fats.  Work with your health care provider or diet and nutrition specialist (dietitian) to adjust your eating plan to your individual calorie needs. This information is not intended to replace advice given to you by your health care provider. Make sure you discuss any questions you have with your health care provider. Document Revised: 07/08/2017 Document Reviewed: 07/19/2016 Elsevier Patient Education  [place].   Insomnia Insomnia is a sleep disorder that makes it difficult to fall asleep or stay asleep. Insomnia can cause fatigue, low energy, difficulty concentrating, mood swings, and poor performance at work or school. There are three different ways to classify insomnia:  Difficulty falling asleep.  Difficulty staying asleep.  Waking up too early in the morning. Any type of insomnia can be long-term (chronic) or short-term (acute). Both are common. Short-term insomnia usually lasts for three months or less. Chronic insomnia occurs at least three times a week for longer than three months. What are the causes? Insomnia may be caused by another condition, situation, or substance, such as:  Anxiety.  Certain medicines.  Gastroesophageal reflux disease (GERD) or other gastrointestinal conditions.  Asthma or other breathing conditions.  Restless legs syndrome, sleep apnea, or other sleep disorders.  Chronic pain.  Menopause.  Stroke.  Abuse of alcohol, tobacco, or illegal drugs.  Mental health conditions, such as depression.  Caffeine.  Neurological disorders, such as Alzheimer's disease.  An overactive thyroid (hyperthyroidism). Sometimes, the cause of insomnia may not be known. What increases the risk? Risk factors for insomnia include:  Gender. Women are affected more often than men.  Age. Insomnia is more common as you get older.  Stress.  Lack of exercise.  Irregular work schedule or working night shifts.  Traveling between different time  zones.  Certain medical and mental health conditions. What are the signs or symptoms? If you have insomnia, the main symptom is having trouble falling asleep or having trouble staying asleep. This may lead to other symptoms, such as:  Feeling fatigued or having low energy.  Feeling nervous about going to sleep.  Not feeling rested in the morning.  Having trouble concentrating.  Feeling irritable, anxious, or depressed. How is this diagnosed? This condition may be diagnosed based on:  Your symptoms and medical history. Your health care provider may ask about: ? Your sleep habits. ? Any medical conditions you have. ? Your mental health.  A physical exam. How is this treated? Treatment for insomnia depends on the cause. Treatment may focus on treating an underlying condition that is causing insomnia. Treatment may also include:  Medicines to help you sleep.  Counseling or therapy.  Lifestyle adjustments to help you sleep better. Follow these instructions at home: Eating and drinking   Limit or avoid alcohol, caffeinated beverages, and cigarettes, especially close to bedtime. These can disrupt your sleep.  Do not eat a large meal or eat spicy foods right before bedtime. This can lead to digestive discomfort that can make it hard for you to sleep. Sleep habits   Keep a sleep diary  to help you and your health care provider figure out what could be causing your insomnia. Write down: ? When you sleep. ? When you wake up during the night. ? How well you sleep. ? How rested you feel the next day. ? Any side effects of medicines you are taking. ? What you eat and drink.  Make your bedroom a dark, comfortable place where it is easy to fall asleep. ? Put up shades or blackout curtains to block light from outside. ? Use a white noise machine to block noise. ? Keep the temperature cool.  Limit screen use before bedtime. This includes: ? Watching TV. ? Using your smartphone,  tablet, or computer.  Stick to a routine that includes going to bed and waking up at the same times every day and night. This can help you fall asleep faster. Consider making a quiet activity, such as reading, part of your nighttime routine.  Try to avoid taking naps during the day so that you sleep better at night.  Get out of bed if you are still awake after 15 minutes of trying to sleep. Keep the lights down, but try reading or doing a quiet activity. When you feel sleepy, go back to bed. General instructions  Take over-the-counter and prescription medicines only as told by your health care provider.  Exercise regularly, as told by your health care provider. Avoid exercise starting several hours before bedtime.  Use relaxation techniques to manage stress. Ask your health care provider to suggest some techniques that may work well for you. These may include: ? Breathing exercises. ? Routines to release muscle tension. ? Visualizing peaceful scenes.  Make sure that you drive carefully. Avoid driving if you feel very sleepy.  Keep all follow-up visits as told by your health care provider. This is important. Contact a health care provider if:  You are tired throughout the day.  You have trouble in your daily routine due to sleepiness.  You continue to have sleep problems, or your sleep problems get worse. Get help right away if:  You have serious thoughts about hurting yourself or someone else. If you ever feel like you may hurt yourself or others, or have thoughts about taking your own life, get help right away. You can go to your nearest emergency department or call:  Your local emergency services (911 in the [place]  A suicide crisis helpline, such as the [place] at 306-535-1363. This is open 24 hours a day. Summary  Insomnia is a sleep disorder that makes it difficult to fall asleep or stay asleep.  Insomnia can be long-term (chronic) or  short-term (acute).  Treatment for insomnia depends on the cause. Treatment may focus on treating an underlying condition that is causing insomnia.  Keep a sleep diary to help you and your health care provider figure out what could be causing your insomnia. This information is not intended to replace advice given to you by your health care provider. Make sure you discuss any questions you have with your health care provider. Document Revised: 07/08/2017 Document Reviewed: 05/05/2017 Elsevier Patient Education  2020 [employer].

## 2019-12-26 NOTE — Progress Notes (Signed)
Chief Complaint  Patient presents with  . Hypertension    blood pressure has been running high-brought in bp machine to be verified. (she actually said she has not ever brought in before).    Patient has been having some headaches, thought maybe it could be from her neck from sleep positions.  She eventually thought to check her BP and saw it in the 160's.  She got alarmed, started feeling a little funny intermittently in her chest (not daily, not with exertion, short-lived, at rest). So she started checking her BP more, noted that some were elevated so scheduled an appointment.  Patient sent over list of BP's checked since 5/14 through [facility].  On 5/14 she checked it 6x, values ranging from 144-166/87-95, pulse 97-108. 5/15 (4x) 120/73 up to 149/96, pulse 93-109 5/16 (2x) 131-135/79-87, pulse 93 5/17 (2x) 129-140/83 pulse 93-95 5/18 (3x) 122/75 up to 151/99, pulse 92-100 Today at 7:45 am BP 118/73, pulse 90  She can tell when her BP is in the 140's or higher, has a slight, nagging headache. Denies any exertional chest pain. We discussed stressors (given that pulse was elevated, and recalling that her husband with cancer started dialysis in 06/2019.)  Last 2 months has been a little better (getting used to what he was allowed to do, not having to watch him as closely). Very busy at work, which has been stressful, has gotten a little behind and she doesn't like that.  Eats out a lot during the week (fried chicken sandwich, occ chips, some [place] food (tries to limit soy sauce).  No major changes to diet.  She just got a treadmill (2 weeks ago), has only been on it twice. Plans to use more reguarly.  Allergies are controlled with zyrtec and nasacort. Using nasacort prn, not this week.  PMH, PSH, SH reviewed  Outpatient Encounter Medications as of 12/26/2019  Medication Sig Note  . cetirizine (ZYRTEC) 10 MG tablet Take 10 mg by mouth daily.   [name] Kitchen levonorgestrel-ethinyl estradiol  (SEASONALE,INTROVALE,JOLESSA) 0.15-0.03 MG tablet Take 1 tablet by mouth daily.    . Multiple Vitamins-Minerals (MULTI-VITAMIN GUMMIES PO) Take 1 each by mouth daily.     [name] Kitchen acetaminophen (TYLENOL) 500 MG tablet Take 500 mg by mouth every 6 (six) hours as needed.   [name] Kitchen albuterol (PROVENTIL HFA;VENTOLIN HFA) 108 (90 Base) MCG/ACT inhaler Inhale 2 puffs into the lungs every 6 (six) hours as needed for wheezing or shortness of breath. You can use 15 mins prior to exercise also (Patient not taking: Reported on 06/18/2019)   . Ascorbic Acid (VITAMIN C PO) Take by mouth. Reported on 10/13/2015   . Esomeprazole Magnesium (NEXIUM 24HR PO) Take 1 capsule by mouth daily. Reported on 12/24/2015 12/24/2015: Uses prn  . triamcinolone (NASACORT) 55 MCG/ACT AERO nasal inhaler Place 2 sprays into the nose daily. (Patient not taking: Reported on 06/01/2017)    No facility-administered encounter medications on file as of 12/26/2019.   No Known Allergies  ROS: no fever, chills, URI symptoms.  Allergies are controlled.  Occasional headaches per HPI, no other neuro symptoms.  Slight short-lived chest discomfort per HPI, none exertional, and none currently.  No GI or GU complaints.  Moods are good, + work stress.   See HPI  PHYSICAL EXAM: BP 130/76   Pulse 96   Ht 4\' 11"  (1.499 m)   Wt 172 lb 6.4 oz (78.2 kg)   BMI 34.82 kg/m    Wt Readings from Last 3 Encounters:  12/26/19 172  lb 6.4 oz (78.2 kg)  06/18/19 179 lb (81.2 kg)  01/31/19 176 lb 9.6 oz (80.1 kg)   Pt's monitor 129/80, checked by nurse (who got 130/76)--accurate monitor. 146/74 on repeat by MD much later in the visit  Well-appearing, pleasant, obese female, in no distress.   HEENT: conjunctiva and sclera are clear, EOMI. Wearing mask Neck: no lymphadenopathy, thyromegaly or mass Heart: regular rate and rhythm, no murmur Lungs: clear bilaterally Back: no spinal or CVA tenderness Abdomen: soft, nontender, obese, no mass or abdominal  bruit Extremities: no edema, normal pulses Psych: normal mood, affect, hygiene and grooming Neuro: alert and oriented, normal gait.  ASSESSMENT/PLAN:  Blood pressure elevated without history of HTN - sporadic elevations at home, not consistent, accurate monitor.  Rec low Na diet, wt loss, daily exercise. Start meds if persistently elevated  BMI 34.0-34.9,adult - counseled re: healthy diet, cut back carbs, weight loss, exercise  Allergic rhinitis, unspecified seasonality, unspecified trigger - counseled re: proper use of antihistamines and nasal sprays (use spray daily, and can cut back to prn use of antihistamine if/when controlled)   Chart reviewed--EKG from 2017 showed LVH. Low threshold to start meds--consider repeat EKG at f/u visit. Counseled extensively re: low sodium diet, 150 mins of cardio, weight loss, stress reduction. If/when starting meds, if pulse remains 90+, might start with CCB or BB rather than diuretic.    Exercise 30 minutes every day. Cut back on the chips, fried foods, and other high sodium foods as we discussed.  Low threshold to start you on a blood pressure medication if you continue to see it frequently in the upper 130's and 140's.  I'd like to give you a little time to work on diet/exercise/weight loss and further monitoring before putting you on a medication that will be long-term.  I recommend using the Nasacort every day (through the rest of the season), and use the zyrtec "as needed", rather than vice versa.  You need to go to bed early and sleep at least 7-8 hours.

## 2020-02-06 ENCOUNTER — Ambulatory Visit: Payer: 59 | Attending: Internal Medicine

## 2020-02-06 DIAGNOSIS — Z20822 Contact with and (suspected) exposure to covid-19: Secondary | ICD-10-CM | POA: Insufficient documentation

## 2020-02-07 LAB — NOVEL CORONAVIRUS, NAA: SARS-CoV-2, NAA: NOT DETECTED

## 2020-02-07 LAB — SARS-COV-2, NAA 2 DAY TAT

## 2020-03-10 ENCOUNTER — Other Ambulatory Visit: Payer: Self-pay

## 2020-03-10 ENCOUNTER — Encounter: Payer: Self-pay | Admitting: Medical

## 2020-03-10 ENCOUNTER — Ambulatory Visit: Payer: 59 | Admitting: Medical

## 2020-03-10 VITALS — BP 150/84 | HR 109 | Ht 59.0 in | Wt 171.4 lb

## 2020-03-10 DIAGNOSIS — R000 Tachycardia, unspecified: Secondary | ICD-10-CM | POA: Diagnosis not present

## 2020-03-10 DIAGNOSIS — R030 Elevated blood-pressure reading, without diagnosis of hypertension: Secondary | ICD-10-CM | POA: Diagnosis not present

## 2020-03-10 NOTE — Progress Notes (Addendum)
Subjective:  Amy Hill is a 54 y.o. female who presents for Chief Complaint  Patient presents with  . Hypertension    x 1week      Here for blood pressure concerns.  She normally sees Dr. Tomi Bamberger..  She had seen Dr. Tomi Bamberger a few months ago for blood pressure concerns.  At that time she was going to monitor her readings.  She has been monitoring blood pressure and pulse.  She has gradually seen increases in her blood pressures from 130s up to 150 range over the last 2 months.  However in the last week or 2 she is seeing numbers even as high as 779 systolic.  Her pulse has fluctuated some but mostly readings under 100.  She denies shortness of breath but she has had some brief chest discomforts ranging left or right chest and last for seconds without any associated symptoms.  This happens occasionally.  No associated shortness of breath, nausea, dizziness, palpitations.  She is a non-smoker.  She drinks limited alcohol maybe 2 times per month.  She does started back exercising with a treadmill but not a lot.  Denies heavy caffeine use  No history of sleep apnea but she sometimes does not feel rested.  She does get sleepy during the daytime sometimes.  She is not sure of any snoring.  No other aggravating or relieving factors.    No other c/o.  The following portions of the patient's history were reviewed and updated as appropriate: allergies, current medications, past family history, past medical history, past social history, past surgical history and problem list.  ROS Otherwise as in subjective above    Objective: BP (!) 150/84   Pulse (!) 109   Ht 4\' 11"  (1.499 m)   Wt 171 lb 6.4 oz (77.7 kg)   SpO2 99%   BMI 34.62 kg/m    BP Readings from Last 3 Encounters:  03/10/20 (!) 150/84  12/26/19 130/76  06/18/19 (!) 150/80   Wt Readings from Last 3 Encounters:  03/10/20 171 lb 6.4 oz (77.7 kg)  12/26/19 172 lb 6.4 oz (78.2 kg)  06/18/19 179 lb (81.2 kg)   General  appearance: alert, no distress, well developed, well nourished Neck: supple, no lymphadenopathy, no thyromegaly, no masses, no bruits Heart: RRR, normal S1, S2, no murmurs Lungs: CTA bilaterally, no wheezes, rhonchi, or rales Abdomen: +bs, soft, non tender, non distended, no masses, no hepatomegaly, no splenomegaly, no bruits Pulses: 2+ radial pulses, 2+ pedal pulses, normal cap refill Ext: no edema  EKG Indication, elevated blood pressure and pulse, rate 92 bpm, PR 146 ms, QRS 84 ms, QTC 425 ms, axis 55 degrees, normal sinus rhythm, possible voltage criteria for LVH, T wave seem peaked throughout the precordial leads,,peaked T waves compared to 2017 EKG.  Assessment: Encounter Diagnoses  Name Primary?  . Elevated blood-pressure reading without diagnosis of hypertension Yes  . Elevated pulse rate      Plan: I reviewed her home blood pressure and pulse readings from late May to current.  Over the last month and 1/2 to 2 months she has gradually seen increases in systolic blood pressure from 130s up to 150s.  Majority readings were 130s initially but then gradually over the last month the numbers have just crept up.  Some recent readings with 1 39-030 systolic readings.  Most of her diastolic readings are normal.  There is occasional elevated 1.  Pulses range normal - 101 at rest.    We will  check some labs today given the gradual rise in readings.  Rule out thyroid issue, rule out hyperkalemia given the T wave elevations, rule out anemia for secondary tachycardia  If labs are normal consider antihypertensive medication.  Counseled on diet, exercise, limiting salt and alcohol, limiting caffeine.  Discussed the need for regular exercise.  Discussed possible sleep study as she has some symptoms of sleep apnea.  Davida was seen today for hypertension.  Diagnoses and all orders for this visit:  Elevated blood-pressure reading without diagnosis of hypertension -     TSH -     Basic  metabolic panel -     CBC -     EKG 12-Lead  Elevated pulse rate -     TSH -     Basic metabolic panel -     CBC -     EKG 12-Lead    Follow up: pending labs

## 2020-03-11 ENCOUNTER — Other Ambulatory Visit: Payer: Self-pay | Admitting: Medical

## 2020-03-11 LAB — CBC
Hematocrit: 41.1 % (ref 34.0–46.6)
Hemoglobin: 13.4 g/dL (ref 11.1–15.9)
MCH: 26.9 pg (ref 26.6–33.0)
MCHC: 32.6 g/dL (ref 31.5–35.7)
MCV: 82 fL (ref 79–97)
Platelets: 291 10*3/uL (ref 150–450)
RBC: 4.99 x10E6/uL (ref 3.77–5.28)
RDW: 14.5 % (ref 11.7–15.4)
WBC: 5.9 10*3/uL (ref 3.4–10.8)

## 2020-03-11 LAB — BASIC METABOLIC PANEL
BUN/Creatinine Ratio: 13 (ref 9–23)
BUN: 10 mg/dL (ref 6–24)
CO2: 19 mmol/L — ABNORMAL LOW (ref 20–29)
Calcium: 9.4 mg/dL (ref 8.7–10.2)
Chloride: 101 mmol/L (ref 96–106)
Creatinine, Ser: 0.76 mg/dL (ref 0.57–1.00)
GFR calc Af Amer: 104 mL/min/{1.73_m2} (ref >59)
GFR calc non Af Amer: 90 mL/min/{1.73_m2} (ref >59)
Glucose: 83 mg/dL (ref 65–99)
Potassium: 4.6 mmol/L (ref 3.5–5.2)
Sodium: 136 mmol/L (ref 134–144)

## 2020-03-11 LAB — TSH: TSH: 1.95 u[IU]/mL (ref 0.450–4.500)

## 2020-03-11 MED ORDER — ATENOLOL 25 MG PO TABS
25.0000 mg | ORAL_TABLET | Freq: Every day | ORAL | 2 refills | Status: DC
Start: 2020-03-11 — End: 2020-05-05

## 2020-04-01 NOTE — Progress Notes (Signed)
Chief Complaint  Patient presents with   Hypertension    follow up on bp, patient has list. From time to time has dull pain in her chest.    Flu Vaccine    would like to wait on flu shot until Nov.   Patient presents for follow up on her blood pressure.  She was started on Atenolol after visit with Audelia Acton earlier this month.  She saw Audelia Acton 8/2 with concern for increasing BP's since late May.  BP's crept up from the 130's to the 150's, with up to 170 prior to visit with him.  There was also some tachycardia noted.  Some T-wave abnormalities (peaked) noted on EKG, so labs were drawn. Potassium was normal at 4.6.  CBC, b-met and TSH were normal.  She had endorsed some unrefreshed sleep and daytime somnolence. They had discussed considering evaluation for sleep apnea. She doesn't think she snores.  She thinks she just doesn't sleep enough, is a light sleeper, doesn't sleep soundly.  Sometimes wakes up a few times a night, usually can back to sleep.  Husband has sleep apnea (on CPAP, doesn't always wear)  Since being on atenolol, BP is running 130-154/74-93 Pulse is running 73-87. She denies headaches, dizziness, chest pain, shortness of breath. She denies side effects to the medication.  She has had some discomfort in her chest and right arm.  Started as a twinge, but now has a dull ache in the center of her chest,sometimes on the R side.  And the pain in the arm is overlying the muscle (bicep), not always occurring at the same time. Comes/goes.  She is walking on the treadmill at least 3x/week, for 2 miles.  No exertional chest pain or arm discomfort.  She is watching the sodium in her diet.  She has a lot of the 72m tablets left at home (thinks she got #90, not #30 with refill).   PMH, PSH, SH reviewed  Outpatient Encounter Medications as of 04/02/2020  Medication Sig   Ascorbic Acid (VITAMIN C PO) Take by mouth. Reported on 10/13/2015   atenolol (TENORMIN) 25 MG tablet Take 1 tablet  (25 mg total) by mouth daily.   levonorgestrel-ethinyl estradiol (SEASONALE,INTROVALE,JOLESSA) 0.15-0.03 MG tablet Take 1 tablet by mouth daily.    Multiple Vitamins-Minerals (MULTI-VITAMIN GUMMIES PO) Take 1 each by mouth daily.     cetirizine (ZYRTEC) 10 MG tablet Take 10 mg by mouth daily. (Patient not taking: Reported on 04/02/2020)   No facility-administered encounter medications on file as of 04/02/2020.   No Known Allergies  ROS: no headache, URI symptoms, fever, chills, exertional chest pain, shortness of breath. No GI or GU complaints. No bleeding, bruising, rash. Slight R chest wall pain and R arm muscular pain, comes/goes See HPI   PHYSICAL EXAM:  BP (!) 148/72    Pulse 76    Ht 4' 11"  (1.499 m)    Wt 169 lb (76.7 kg)    BMI 34.13 kg/m   140/74 on repeat by MD  Wt Readings from Last 3 Encounters:  04/02/20 169 lb (76.7 kg)  03/10/20 171 lb 6.4 oz (77.7 kg)  12/26/19 172 lb 6.4 oz (78.2 kg)    Well-appearing, pleasant, obese female, in no distress.   HEENT: conjunctiva and sclera are clear, EOMI. Wearing mask Neck: no lymphadenopathy, thyromegaly or mass Heart: regular rate and rhythm, no murmur Chest wall: nontender Lungs: clear bilaterally Back: no spinal or CVA tenderness Abdomen: soft, nontender Extremities: no edema, normal pulses Psych:  normal mood, affect, hygiene and grooming Neuro: alert and oriented, normal gait.   ASSESSMENT/PLAN:  Essential hypertension - BP's still above goal.  Pulse improved. Increase atenolol to 27m daily. monitor BP and pulse and send in list in 2 weeks.    Consider HCTZ if no change/not at goal with 565matenolol. Will need change to 5083mablet and rx sent in if the higher dose is effective. If not effective, consider combo (tenoretic)  Continue to monitor your blood pressure regularly. Continue low sodium diet, exercise and weight loss.  Increase the atenolol to 26m26mily.  You can take two of the 25mg60mlets at one  time. If it makes you sleepy, change the dosing to the evening. Keep track of the blood pressure and the pulse, and send us [place] readings of both in 2-3 weeks.

## 2020-04-02 ENCOUNTER — Other Ambulatory Visit: Payer: Self-pay

## 2020-04-02 ENCOUNTER — Encounter: Payer: Self-pay | Admitting: Family Medicine

## 2020-04-02 ENCOUNTER — Ambulatory Visit: Payer: 59 | Admitting: Family Medicine

## 2020-04-02 VITALS — BP 140/74 | HR 76 | Ht 59.0 in | Wt 169.0 lb

## 2020-04-02 DIAGNOSIS — I10 Essential (primary) hypertension: Secondary | ICD-10-CM

## 2020-04-02 NOTE — Patient Instructions (Signed)
  Continue to monitor your blood pressure regularly. Continue low sodium diet, exercise and weight loss.  Increase the atenolol to 50mg  daily.  You can take two of the 25mg  tablets at one time. If it makes you sleepy, change the dosing to the evening. Keep track of the blood pressure and the pulse, and send [place] your readings of both in 2-3 weeks.  Be sure to get [place] your readings prior to needing a refill--as if the 50mg  dose is working well, we will change the dose to a 50mg  tabllet. If you have had no response, I likely will change your medication and have you follow up in the office afterwards.  Ask your husband if you snore and if you have times where you have pauses in your breathing.

## 2020-04-23 ENCOUNTER — Other Ambulatory Visit: Payer: 59

## 2020-04-23 ENCOUNTER — Other Ambulatory Visit: Payer: Self-pay

## 2020-04-23 ENCOUNTER — Other Ambulatory Visit: Payer: Self-pay | Admitting: Critical Care Medicine

## 2020-04-23 DIAGNOSIS — Z20822 Contact with and (suspected) exposure to covid-19: Secondary | ICD-10-CM

## 2020-04-25 LAB — NOVEL CORONAVIRUS, NAA: SARS-CoV-2, NAA: NOT DETECTED

## 2020-04-25 LAB — SARS-COV-2, NAA 2 DAY TAT

## 2020-05-04 ENCOUNTER — Encounter: Payer: Self-pay | Admitting: Family Medicine

## 2020-05-04 DIAGNOSIS — I10 Essential (primary) hypertension: Secondary | ICD-10-CM

## 2020-05-05 ENCOUNTER — Telehealth: Payer: Self-pay | Admitting: Family Medicine

## 2020-05-05 MED ORDER — ATENOLOL 50 MG PO TABS: ORAL_TABLET | ORAL | 1 refills | Status: DC

## 2020-05-05 NOTE — Telephone Encounter (Signed)
error 

## 2020-05-07 ENCOUNTER — Telehealth: Payer: Self-pay

## 2020-05-07 ENCOUNTER — Other Ambulatory Visit: Payer: Self-pay | Admitting: *Deleted

## 2020-05-07 DIAGNOSIS — Z1211 Encounter for screening for malignant neoplasm of colon: Secondary | ICD-10-CM

## 2020-05-07 NOTE — Telephone Encounter (Signed)
This was sent to me. I looked at her last CPE, it said that she tried to scheduled one last Sept/Oct with Dr.Mann and she owed money and she would call and schedule once she paid it off. Is it okay to put referral in?

## 2020-05-07 NOTE — Telephone Encounter (Signed)
Okay for referral?

## 2020-05-07 NOTE — Telephone Encounter (Signed)
Done

## 2020-05-07 NOTE — Telephone Encounter (Signed)
Pt. Got an emmi call stating she needed to schedule a colonoscopy she is due for one. She said it then transferred her to our office. She said it has been a long time since she had one and is over due for one if you could send a referral for a colonoscopy.

## 2020-05-28 ENCOUNTER — Other Ambulatory Visit: Payer: Self-pay | Admitting: Family Medicine

## 2020-05-28 DIAGNOSIS — I10 Essential (primary) hypertension: Secondary | ICD-10-CM

## 2020-06-04 ENCOUNTER — Other Ambulatory Visit: Payer: Self-pay | Admitting: Medical

## 2020-06-05 ENCOUNTER — Encounter: Payer: Self-pay | Admitting: Family Medicine

## 2020-06-28 ENCOUNTER — Other Ambulatory Visit: Payer: Self-pay | Admitting: Family Medicine

## 2020-06-28 DIAGNOSIS — I10 Essential (primary) hypertension: Secondary | ICD-10-CM

## 2020-07-07 ENCOUNTER — Other Ambulatory Visit: Payer: Self-pay | Admitting: Medical

## 2020-07-09 NOTE — Progress Notes (Signed)
Chief Complaint  Patient presents with  . Annual Exam    fasting annual exam no pap-sees Dr. Philis Pique. Had eye exam about 2 months ago. No new concerns. Did mention that she owes Dr. Lorie Apley office [phone], would like to know if could just refer her to [place] instead for colonoscopy.     Amy Hill is a 54 y.o. female who presents for a complete physical.  She has the following concerns:  Asking about taking aspirin and stool softeners  Hypertension:  She was started on atenolol for high blood pressure by Audelia Acton in 03/2020.  Dose was gradually titrated up to 157m (started 05/14/20).  She is tolerating this without side effects, other than slight constipation noted. BP's are running 118-136/77-86 in the last 10 days.  Prior values run up to 140-150's sporadically, most often in the 130's. Pulse is running 70-80. She has been exercising daily (noticed improvement in BP's with exercise). She is trying to follow low sodium diet. She denies headaches, dizziness, chest pain, shortness of breath, edema. Rarely has some chest discomfort.  Often BP is a little high when she feels this. Has been off/on since August, and she checks her BP and it will be up (150 or higher). It resolves without treatment, sometimes lasts 10-15 minutes, up to an hour. (EKG done 03/2020, showing LVH) Never has any exertional chest pain, no shortness of breath.  Husband has stage 4 prostate cancer, treated with Lupron injections q6 mos (not a candidate for chemo or radiation due to his kidney disease, per pt). Tumors caused hydronephrosis, has stents, back pain.  Has been on dialysis since 06/2019. He is now working a part-time job [place]truck [place]  She has known fibroid uterus. She is under the care of Dr. HPhilis Pique She continues on Seasonale, only every 3 months, no breakthrough (rare spotting).  Cycles are much better--not as heavy.  Allergies--symptoms aresporadic, sometimes in spring,  sometimes in Fall. Not having many symptoms currently.Nasacort is effective when symptomatic, not needing currently.  H/o GERD: She is not currently taking any medication. Hasn't needed to use any Nexium in a long time (6-8 months ago).  Immunization History  Administered Date(s) Administered  . Influenza,inj,Quad PF,6+ Mos 06/01/2017, 06/07/2018, 06/18/2019  . PFIZER SARS-COV-2 Vaccination 10/20/2019, 11/14/2019, 07/06/2020  . Td 04/09/2002  . Tdap 01/21/2011   Last Pap smear: through GYN,03/2016 normal pap, negative HPV. Saw GYN in 05/2019, scheduled in January 2022. Last mammogram:05/2019 Last colonoscopy: 01/2009, Dr. MCollene Mares due again 01/2014 but didn't have. She called again in Sept/Oct 2020 to schedule, and found out she owes money, so will schedule when paid off. She plans to do this, but hasn't yet. Last DEXA: never Ophtho: yearly Dentist: about6-7 years ago Exercise:Walking on the treadmill daily (every day)--10-15 minutes before work, and at least 30 minutes at night. Doing some situps (2x/week). No weight-bearing exercise. Vitamin D level:42in5/2017 Lipids: Lab Results  Component Value Date   CHOL 176 06/18/2019   HDL 51 06/18/2019   LDLCALC 112 (H) 06/18/2019   TRIG 69 06/18/2019   CHOLHDL 3.5 06/18/2019     PMH, PSH, SH and FH were reviewed and updated  Outpatient Encounter Medications as of 07/10/2020  Medication Sig Note  . Ascorbic Acid (VITAMIN C PO) Take by mouth. Reported on 10/13/2015   . atenolol (TENORMIN) 50 MG tablet TAKE 1.5 TABLETS FOR 1 WEEK, AND THEN INCREASE TO 2 TABLETS IF NEEDED TO REACH GOAL BP <130/80 07/10/2020: Takes 2  tablets in the evening  . levonorgestrel-ethinyl estradiol (SEASONALE,INTROVALE,JOLESSA) 0.15-0.03 MG tablet Take 1 tablet by mouth daily.    . Multiple Vitamins-Minerals (MULTI-VITAMIN GUMMIES PO) Take 1 each by mouth daily.     . cetirizine (ZYRTEC) 10 MG tablet Take 10 mg by mouth daily. (Patient not taking: Reported on  04/02/2020)    No facility-administered encounter medications on file as of 07/10/2020.   No Known Allergies   ROS: The patient denies anorexia, fever, headaches, vision changes, decreased hearing, ear pain, sore throat, breast concerns, palpitations, dizziness, syncope, cough, swelling, nausea, vomiting, diarrhea, constipation, abdominal pain, melena, hematochezia, hematuria, incontinence, dysuria, irregular menstrual cycles, vaginal discharge, odor or itch, genital lesions, joint pains, numbness, tingling, weakness, tremor, suspicious skin lesions, depression, abnormal bleeding/bruising, or enlarged lymph nodes. Allergies are controlled with prn medications, not bothering her currently. No longer having much heartburn. Chest pain intermittently per HPI   PHYSICAL EXAM:  BP 132/72   Pulse 80   Ht 4' 11"  (1.499 m)   Wt 164 lb 3.2 oz (74.5 kg)   BMI 33.16 kg/m   Wt Readings from Last 3 Encounters:  07/10/20 164 lb 3.2 oz (74.5 kg)  04/02/20 169 lb (76.7 kg)  03/10/20 171 lb 6.4 oz (77.7 kg)   General Appearance: Alert, cooperative, no distress, appears stated age.  Head:  Normocephalic, without obvious abnormality, atraumatic   Eyes:  PERRL, conjunctiva/corneas clear, EOM's intact, fundi benign   Ears:  Normal TM's and external ear canals   Nose:  Not examined, wearing mask due to COVID-19 pandemic  Throat:  Not examined, wearing mask due to COVID-19 pandemic  Neck:  Supple, no lymphadenopathy; thyroid: no enlargement/tenderness/nodules; no carotid bruit or JVD. Spine nontender.   Back:  Spine nontender, no curvature, ROM normal, no CVA tenderness   Lungs:  Clear to auscultation bilaterally without wheezes, rales or ronchi; respirations unlabored   Chest Wall:  No tenderness or deformity   Heart:  Regular rate and rhythm, S1 and S2 normal, no murmur, rub or gallop   Breast Exam:  Deferred to GYN   Abdomen:  Soft, non-tender,  nondistended, normoactive bowel sounds,  no masses, no hepatosplenomegaly   Genitalia:  Deferred to GYN      Extremities:  No clubbing, cyanosis or edema.  Pulses:  2+ and symmetric all extremities   Skin:  Skin color, texture, turgor normal, no rashes or lesions. Has a hyperpigmented spot at right medial upper arm (site of prior tick bite, flat; uncanged).  She also has an area of linear hyperpigmentation (scar) next to a benign-appearing mole a little more laterally. Hyperpigmentation at right lateral lower leg prior trauma) Birthmark at left anterior lower leg (lower shin, above ankle). No changes to skin.  Lymph nodes:  Cervical, supraclavicular, and axillary nodes normal   Neurologic:  CNII-XII intact, normal strength, sensation and gait; reflexes 2+ and symmetric throughout    Psych: Normal mood, affect, hygiene and grooming   ASSESSMENT/PLAN:  Annual physical exam - Plan: POCT Urinalysis DIP (Proadvantage Device)  Essential hypertension - controlled, cont atenolol 18m daily. Cont daily exercise, low Na diet. Wt loss recommended - Plan: atenolol (TENORMIN) 100 MG tablet  H/O adenomatous polyp of colon - past due for colonoscopy (was due 2015), will schedule  Screening for malignant neoplasm of colon - Will schedule (and/or contact [place] referral to [facility] if unable to get in with Dr. MCollene Mares Chest pain, unspecified type - not exertional, off/on since August. EKG with LVH  then. No exertional pain. Poss related to elevated BP vs anxiety contributing. f/u if changes  Ok for stool softeners daily (plus high fiber diet, plenty of water). Does not need to take daily aspirin.  F/u as scheduled in January with Dr. Philis Pique   Had cbc, b-met and TSH in 03/2020. Can check fasting b-met now, no other labs needed. HepC/HIV screening discussed. Declines--will wait until labs needed next year; may donate blood before then.  RF atenolol,  change to 132m tabs   Discussed monthly self breast exams and yearly mammograms; at least 30 minutes of aerobic activity at least 5 days/week, weight-bearing exercise 2-3x/wk; proper sunscreen use reviewed; healthy diet, including goals of calcium and vitamin D intake and alcohol recommendations (less than or equal to 1 drink/day) reviewed; regular seatbelt use; changing batteries in smoke detectors. Immunization recommendations discussed--continue yearly flu shots (can't give today, had recent COVID booster--can get at pharmacy or return here in 2 weeks).Shingrix recommended, risks/side effects reviewed. To check insurance and return for [place].Colonoscopy was due in June 2015--she now knows that she has a $500 balance she will have to pay off before she can schedule her next colonoscopy. She plans to call them and set up a payment plan and schedule colonoscpy (and/or contact u[place]for referral elsewhere if that proves very difficult for her). Encouraged routine dental cleanings.  She knows that dentist and colonoscopy are her priorities on her to-do list.  F/u 6 months, sooner prn.

## 2020-07-09 NOTE — Patient Instructions (Addendum)
  HEALTH MAINTENANCE RECOMMENDATIONS:  It is recommended that you get at least 30 minutes of aerobic exercise at least 5 days/week (for weight loss, you may need as much as 60-90 minutes). This can be any activity that gets your heart rate up. This can be divided in 10-15 minute intervals if needed, but try and build up your endurance at least once a week.  Weight bearing exercise is also recommended twice weekly.  Eat a healthy diet with lots of vegetables, fruits and fiber.  "Colorful" foods have a lot of vitamins (ie green vegetables, tomatoes, red peppers, etc).  Limit sweet tea, regular sodas and alcoholic beverages, all of which has a lot of calories and sugar.  Up to 1 alcoholic drink daily may be beneficial for women (unless trying to lose weight, watch sugars).  Drink a lot of water.  Calcium recommendations are 1200-1500 mg daily (1500 mg for postmenopausal women or women without ovaries), and vitamin D 1000 IU daily.  This should be obtained from diet and/or supplements (vitamins), and calcium should not be taken all at once, but in divided doses.  Monthly self breast exams and yearly mammograms for women over the age of 28 is recommended.  Sunscreen of at least SPF 30 should be used on all sun-exposed parts of the skin when outside between the hours of 10 am and 4 pm (not just when at beach or pool, but even with exercise, golf, tennis, and yard work!)  Use a sunscreen that says "broad spectrum" so it covers both UVA and UVB rays, and make sure to reapply every 1-2 hours.  Remember to change the batteries in your smoke detectors when changing your clock times in the spring and fall. Carbon monoxide detectors are recommended for your home.  Use your seat belt every time you are in a car, and please drive safely and not be distracted with cell phones and texting while driving.  Please return for a flu shot  (needs to be 2 weeks from your COVID booster).  I recommend getting the new  shingles vaccine (Shingrix). If you have commercial insurance, check with your insurance to verify what your out of pocket cost may be (usually covered as preventative, but better to verify to avoid any surprises, as this vaccine is expensive), and then schedule a nurse visit at our office when convenient (based on the possible side effects as discussed).   This is a series of 2 injections, spaced 2 months apart.  It doesn't have to be exactly 2 months apart (but can't be under 2 months), if that isn't feasible for your schedule, but try and get them close to 2 months (and definitely within 6 months of each other, or else the efficacy of the vaccine drops off).  You should wait 2-4 weeks after other vaccines before getting Shingrix (can be given at the same time as your flu shot, if you want).  It is fine to take stool softeners daily if needed (docusate sodium/colace). You do not need to start a baby aspirin.  Call Dr. Lorie Apley office and set up payment plan and schedule colonoscopy.  You are long past due.  If there are any issues with them scheduling this, and you prefer to go elsewhere, let us know and we can refer you to [facility] (though they will likely want her records from the last colonoscopy).  Please schedule routine dental cleaning.

## 2020-07-10 ENCOUNTER — Ambulatory Visit (INDEPENDENT_AMBULATORY_CARE_PROVIDER_SITE_OTHER): Payer: 59 | Admitting: Family Medicine

## 2020-07-10 ENCOUNTER — Other Ambulatory Visit: Payer: Self-pay

## 2020-07-10 ENCOUNTER — Encounter: Payer: Self-pay | Admitting: Family Medicine

## 2020-07-10 VITALS — BP 132/72 | HR 80 | Ht 59.0 in | Wt 164.2 lb

## 2020-07-10 DIAGNOSIS — Z1211 Encounter for screening for malignant neoplasm of colon: Secondary | ICD-10-CM

## 2020-07-10 DIAGNOSIS — Z86010 Personal history of colonic polyps: Secondary | ICD-10-CM

## 2020-07-10 DIAGNOSIS — I10 Essential (primary) hypertension: Secondary | ICD-10-CM

## 2020-07-10 DIAGNOSIS — Z0000 Encounter for general adult medical examination without abnormal findings: Secondary | ICD-10-CM | POA: Diagnosis not present

## 2020-07-10 DIAGNOSIS — R079 Chest pain, unspecified: Secondary | ICD-10-CM

## 2020-07-10 LAB — POCT URINALYSIS DIP (PROADVANTAGE DEVICE)
Blood, UA: NEGATIVE
Glucose, UA: NEGATIVE mg/dL
Ketones, POC UA: NEGATIVE mg/dL
Leukocytes, UA: NEGATIVE
Nitrite, UA: NEGATIVE
Specific Gravity, Urine: 1.025
Urobilinogen, Ur: NEGATIVE
pH, UA: 6 (ref 5.0–8.0)

## 2020-07-10 MED ORDER — ATENOLOL 100 MG PO TABS: 100.0000 mg | ORAL_TABLET | Freq: Every day | ORAL | 1 refills | Status: DC

## 2020-07-30 ENCOUNTER — Other Ambulatory Visit: Payer: Self-pay | Admitting: Family Medicine

## 2020-07-30 DIAGNOSIS — I10 Essential (primary) hypertension: Secondary | ICD-10-CM

## 2020-08-05 ENCOUNTER — Telehealth: Payer: Self-pay | Admitting: Family Medicine

## 2020-08-05 NOTE — Telephone Encounter (Signed)
Advise pt this was sent to her pharmacy on 12/2, with a message to keep it on file for when she needs it.  I'll send her a message through MyChart--if she hasn't read it, then please call her.  Thanks

## 2020-08-05 NOTE — Telephone Encounter (Signed)
Patient left voicemail that she needs her bp meds refilled She states she needs it for 100mg  instead of 50mg  she staets that you are aware of this

## 2020-08-06 NOTE — Telephone Encounter (Signed)
Called pharmacy and made sure it was ready and let patient know as well.

## 2020-08-29 LAB — HM PAP SMEAR: HM Pap smear: NEGATIVE

## 2020-08-29 LAB — HM MAMMOGRAPHY

## 2020-09-22 ENCOUNTER — Telehealth: Payer: Self-pay | Admitting: Family Medicine

## 2020-09-22 NOTE — Telephone Encounter (Signed)
Received requested records from [facility] 

## 2020-09-24 ENCOUNTER — Encounter: Payer: Self-pay | Admitting: *Deleted

## 2021-01-15 ENCOUNTER — Encounter: Payer: 59 | Admitting: Family Medicine

## 2021-01-27 ENCOUNTER — Other Ambulatory Visit: Payer: Self-pay | Admitting: Family Medicine

## 2021-01-27 DIAGNOSIS — I10 Essential (primary) hypertension: Secondary | ICD-10-CM

## 2021-01-28 ENCOUNTER — Encounter: Payer: 59 | Admitting: Family Medicine

## 2021-02-03 NOTE — Progress Notes (Signed)
Chief Complaint  Patient presents with   Hypertension    Fasting med check. Ok for Tdap. Has not checked insurance re:Shingrix. Had covid 01/14/21 would like to know when she is able to get booster #2? Also IR:[id number], has paid half of what she owes them , so should be good to schedule soon. Has not scheduled with dentist yet. No new concerns.    Hypertension:  She was started on atenolol for high blood pressure by Shane in 03/2020.  Dose was gradually titrated up to 100mg  (started 05/14/20).  She is tolerating this without side effects. No longer having any constipation. BP's varies from 114 -130/78-80's when feeling good.  Goes into the 140's and up to a high of 162/99 related to significant work stress, poor sleep, etc.  She has been working very hard and too many hours over the last 3-4 months. Mostly running 115-135/80-86. Pulse is running in the 70's. She has been exercising daily, and is trying to follow low sodium diet. She has occasional headaches (BP slightly higher in 140's when had headaches); denies dizziness, chest pain, shortness of breath, edema. Never has any exertional chest pain, no shortness of breath.   Husband has stage 4 prostate cancer, treated with Lupron injections q6 mos (not a candidate for chemo or radiation due to his kidney disease, per pt). Tumors caused hydronephrosis, has stents, back pain.  Has been on dialysis since 06/2019. He is working a part-time job (truck Press photographer in [place]), doing well.    PMH, PSH, SH reviewed  Outpatient Encounter Medications as of 02/04/2021  Medication Sig   Ascorbic Acid (VITAMIN C PO) Take by mouth. Reported on 10/13/2015   atenolol (TENORMIN) 100 MG tablet TAKE 1 TABLET BY MOUTH EVERYDAY AT BEDTIME   cetirizine (ZYRTEC) 10 MG tablet Take 10 mg by mouth daily.   FYAVOLV 1-5 MG-MCG TABS tablet Take 1 tablet by mouth daily.   Multiple Vitamins-Minerals (MULTI-VITAMIN GUMMIES PO) Take 1 each by mouth daily.      [DISCONTINUED] atenolol (TENORMIN) 100 MG tablet Take 1 tablet (100 mg total) by mouth at bedtime.   [DISCONTINUED] levonorgestrel-ethinyl estradiol (SEASONALE,INTROVALE,JOLESSA) 0.15-0.03 MG tablet Take 1 tablet by mouth daily.    No facility-administered encounter medications on file as of 02/04/2021.   No Known Allergies   ROS: Occasional headache, no URI symptoms, fever, chills, exertional chest pain, shortness of breath. No GI or GU complaints. Constipation resolved. No bleeding, bruising, rash, joint pains. Gained some weight since last visit. She is working a lot, and "not eating right". Still exercising daily.   PHYSICAL EXAM:  BP 134/72   Pulse 72   Ht 4\' 11"  (1.499 m)   Wt 170 lb 9.6 oz (77.4 kg)   BMI 34.46 kg/m   Wt Readings from Last 3 Encounters:  02/04/21 170 lb 9.6 oz (77.4 kg)  07/10/20 164 lb 3.2 oz (74.5 kg)  04/02/20 169 lb (76.7 kg)   Well-appearing, pleasant, obese female, in no distress.   HEENT: conjunctiva and sclera are clear, EOMI. Wearing mask Neck: no lymphadenopathy, thyromegaly or mass Heart: regular rate and rhythm, no murmur Chest wall: nontender Lungs: clear bilaterally Back: no spinal or CVA tenderness Abdomen: soft, nontender Extremities: no edema, normal pulses Psych: normal mood, affect, hygiene and grooming Neuro: alert and oriented, normal gait  ASSESSMENT/PLAN:  Essential hypertension - Well controlled, occasionally high related to poor sleep, work stress.  cont curent meds, low Na diet, regular exercise, stress reduction  Need for Tdap  vaccination - Plan: Tdap vaccine greater than or equal to 7yo IM  BMI 34.0-34.9,adult - counseled re: healthy diet, healthy options for fast food, encouraged weight loss. reviewed mindful eating measures  Work stress - counseling provided. May need to talk to supervisor about her hours, affecting her health (weight gain, higher BP's).   Will check insurance for Shingrix. Pt reminded to schedule  colonoscopy and dental visits.  Atenolol recently refilled x90d  F/u as scheduled in December for CPE, sooner prn.  I spent 34 minutes dedicated to the care of this patient, including pre-visit review of records, face to face time, post-visit ordering of testing and documentation.

## 2021-02-04 ENCOUNTER — Other Ambulatory Visit: Payer: Self-pay

## 2021-02-04 ENCOUNTER — Encounter: Payer: Self-pay | Admitting: Family Medicine

## 2021-02-04 ENCOUNTER — Ambulatory Visit: Payer: 59 | Admitting: Family Medicine

## 2021-02-04 VITALS — BP 134/72 | HR 72 | Ht 59.0 in | Wt 170.6 lb

## 2021-02-04 DIAGNOSIS — Z23 Encounter for immunization: Secondary | ICD-10-CM | POA: Diagnosis not present

## 2021-02-04 DIAGNOSIS — I10 Essential (primary) hypertension: Secondary | ICD-10-CM | POA: Diagnosis not present

## 2021-02-04 DIAGNOSIS — Z566 Other physical and mental strain related to work: Secondary | ICD-10-CM

## 2021-02-04 DIAGNOSIS — Z6834 Body mass index (BMI) 34.0-34.9, adult: Secondary | ICD-10-CM | POA: Diagnosis not present

## 2021-02-04 NOTE — Patient Instructions (Addendum)
Schedule a nurse visit for the first shingles vaccine at least 2 weeks after today's vaccine.  The second dose is 2 months later. COVID vaccine booster can be delayed 1-3 months since you recently had the illness.  Please try and make healthier choices in food, avoid any late-night eating and frequent snacking. Do not eat while working--be mindful when eating (that you're hungry, sense when you're full, and taste each bite). Try and lose some weight.  Continue your daily exercise. Blood pressure looks good. Be sure to take care of yourself--life isn't all about the job.  Be sure to speak up if your schedule and workload doesn't improve.  Please schedule your colonoscopy. Schedule your dental visit--sometimes these book out 6 months in advance.

## 2021-05-02 ENCOUNTER — Other Ambulatory Visit: Payer: Self-pay | Admitting: Family Medicine

## 2021-05-02 DIAGNOSIS — I10 Essential (primary) hypertension: Secondary | ICD-10-CM

## 2021-06-03 ENCOUNTER — Ambulatory Visit: Payer: 59 | Admitting: Family Medicine

## 2021-06-03 ENCOUNTER — Encounter: Payer: Self-pay | Admitting: Family Medicine

## 2021-06-03 ENCOUNTER — Other Ambulatory Visit: Payer: Self-pay

## 2021-06-03 VITALS — BP 136/70 | HR 84 | Ht 59.0 in | Wt 170.0 lb

## 2021-06-03 DIAGNOSIS — M25561 Pain in right knee: Secondary | ICD-10-CM | POA: Diagnosis not present

## 2021-06-03 DIAGNOSIS — Z23 Encounter for immunization: Secondary | ICD-10-CM

## 2021-06-03 NOTE — Patient Instructions (Signed)
Your knee exam was normal. If you have recurrent pain, you can take some ibuprofen or aleve for up to a week (be sure to take it with food). If pain persists, or recurrent swelling, knee is giving out or locking, then please let us know.

## 2021-06-03 NOTE — Progress Notes (Signed)
Chief Complaint  Patient presents with   Knee Pain    Right knee swollen and pops with certain movement. Noticed the swelling late Friday night and popping started Saturday. Has been icing at night.     Friday night (10/21), while showering she noted the R knee looked swollen. No known injury/fall/twisting. She had been at a football game, up and down bleachers. She noticed popping the following day.  It feels a little tight, but the popping isn't painful (maybe just slight).  She has iced it, which has helped with the swelling. Denies any locking or giving way.    PMH, PSH, SH reviewed  Outpatient Encounter Medications as of 06/03/2021  Medication Sig   Ascorbic Acid (VITAMIN C PO) Take by mouth. Reported on 10/13/2015   atenolol (TENORMIN) 100 MG tablet TAKE 1 TABLET BY MOUTH EVERYDAY AT BEDTIME   cetirizine (ZYRTEC) 10 MG tablet Take 10 mg by mouth daily.   FYAVOLV 1-5 MG-MCG TABS tablet Take 1 tablet by mouth daily.   Multiple Vitamins-Minerals (MULTI-VITAMIN GUMMIES PO) Take 1 each by mouth daily.     No facility-administered encounter medications on file as of 06/03/2021.   No Known Allergies   ROS: Denies fever, chills, URI symptoms, headaches, dizziness, shortness of breath, chest pain.  Denies nausea, vomiting, bowel changes, urinary complaints, bleeding, bruising, rash. See HPI   PHYSICAL EXAM:  BP 136/70   Pulse 84   Ht 4\' 11"  (1.499 m)   Wt 170 lb (77.1 kg)   BMI 34.34 kg/m   Well-appearing ,pleasant overweight female in no distress, in good spirits.  Knees: no effusion, warmth, or any asymmetry noted. Nontender to exam (joint lines, medial, lateral, along tendons, bursa--entirely nontender) Slight crepitus over patella only.  No other reproducible popping. FROM.  Negative Lachman, Drawer, McMurray, no pain with valgus and varus stress.   ASSESSMENT/PLAN:  Acute pain of right knee - had brief swelling/discomfort (poss related to stairs/bleachers).   Reassured regarding normal exam today. ice and ibuprofen if recurs.  Need for influenza vaccination - Plan: Flu Vaccine QUAD 6+ mos PF IM (Fluarix Quad PF)  Need for COVID-19 vaccine - Plan: Pension scheme manager

## 2021-07-19 NOTE — Patient Instructions (Addendum)
HEALTH MAINTENANCE RECOMMENDATIONS:  It is recommended that you get at least 30 minutes of aerobic exercise at least 5 days/week (for weight loss, you may need as much as 60-90 minutes). This can be any activity that gets your heart rate up. This can be divided in 10-15 minute intervals if needed, but try and build up your endurance at least once a week.  Weight bearing exercise is also recommended twice weekly.  Eat a healthy diet with lots of vegetables, fruits and fiber.  "Colorful" foods have a lot of vitamins (ie green vegetables, tomatoes, red peppers, etc).  Limit sweet tea, regular sodas and alcoholic beverages, all of which has a lot of calories and sugar.  Up to 1 alcoholic drink daily may be beneficial for women (unless trying to lose weight, watch sugars).  Drink a lot of water.  Calcium recommendations are 1200-1500 mg daily (1500 mg for postmenopausal women or women without ovaries), and vitamin D 1000 IU daily.  This should be obtained from diet and/or supplements (vitamins), and calcium should not be taken all at once, but in divided doses.  Monthly self breast exams and yearly mammograms for women over the age of 8 is recommended.  Sunscreen of at least SPF 30 should be used on all sun-exposed parts of the skin when outside between the hours of 10 am and 4 pm (not just when at beach or pool, but even with exercise, golf, tennis, and yard work!)  Use a sunscreen that says "broad spectrum" so it covers both UVA and UVB rays, and make sure to reapply every 1-2 hours.  Remember to change the batteries in your smoke detectors when changing your clock times in the spring and fall. Carbon monoxide detectors are recommended for your home.  Use your seat belt every time you are in a car, and please drive safely and not be distracted with cell phones and texting while driving.   I recommend getting the new shingles vaccine (Shingrix). You may want to check with your insurance to verify  what your out of pocket cost may be (usually covered as preventative, but better to verify to avoid any surprises, as this vaccine is expensive), and then schedule a nurse visit at our office when convenient (based on the possible side effects as discussed).   This is a series of 2 injections, spaced 2 months apart.  It doesn't have to be exactly 2 months apart (but can't be sooner), if that isn't feasible for your schedule, but try and get them close to 2 months (and definitely within 6 months of each other, or else the efficacy of the vaccine drops off). This should be separated from other vaccines by at least 2 weeks.  Your blood pressure overall is borderline high (often 135/85), but when you get enough sleep, it is perfect (<120/80).  Rather than making adjustments to your medications that might make you feel dizzy when you get the sleep you need, PLEASE try and work hard to get adequate sleep most nights. Set an alarm to help remind you to wind down and get to bed.  Keep up with the regular exercise. Continue to work on limiting sodium in your diet.  Go to [facility] (either [address]) for an x-ray of your right knee.  This will let us know if there is any underlying arthritis. Weight loss should help with knee pain. Consider taking Aleve twice daily for 7-10 days (with food) to see if this helps  with the pain/swelling.

## 2021-07-19 NOTE — Progress Notes (Signed)
Chief Complaint  Patient presents with   Annual Exam    Fasting annual exam, sees Dr. Philis Pique and has eye exam yearly. Still having some issues with her R knee, not worsening. Pain and swelling. Does not want shingrix today due to potential side effects, would prefer to schedule NV for a Friday. Brought in BP logs.     Amy Hill is a 55 y.o. female who presents for a complete physical.  She has the following concerns:  Last seen in October with complaint of R knee pain. Denies any worsening.  She notes some popping and cracking, with some intermittent swelling.  No pain with walking.  There doesn't seem to be a pattern that she has noticed. She notes the popping and cracking daily.  At night she notice the swelling--feels like it is the whole R leg (below knee), and some at the knee.  Denies any giving way or locking.  Hypertension:  She is compliant with taking 163m of atenolol daily.  She is tolerating this without side effects. She has been exercising daily, and is trying to follow low sodium diet. She has occasional headaches (related to stressful day at work); denies dizziness, chest pain, shortness of breath, edema (just sock marks). BP's tend to be lowest when exercising and getting enough sleep.  Is higher related to poor sleep, 140's/upper 80's-90.  Highest BP was 151/93 when she had been out of meds for a couple of days. Mostly 135/85, but frequently has many days of 120 or less/70's when getting adequate sleep. Pulse is running in the 70's (65-87).  Husband has stage 4 prostate cancer, treated with Lupron injections q6 mos (not a candidate for chemo or radiation due to his kidney disease, per pt). Tumors caused hydronephrosis, has stents, back pain.  Has been on dialysis since 06/2019. He is working a part-time job [place] He continues to do well.   She has known fibroid uterus. She is under the care of Dr. HPhilis Pique  She was switched from  Seasonale to HRT in 08/2020. She is due to see GYN.  She had some spotting after the switch in hormones, but none in the last 2 months.  Allergies--symptoms are sporadic, sometimes in spring, sometimes in Fall. Not having many symptoms currently. Nasacort is effective when symptomatic, not needing currently.    Immunization History  Administered Date(s) Administered   Influenza,inj,Quad PF,6+ Mos 06/01/2017, 06/07/2018, 06/18/2019, 06/03/2021   Influenza-Unspecified 07/23/2020   PFIZER(Purple Top)SARS-COV-2 Vaccination 10/20/2019, 11/14/2019, 07/06/2020   Pfizer Covid-19 Vaccine Bivalent Booster 121yr& up 06/03/2021   Td 04/09/2002   Tdap 01/21/2011, 02/04/2021   Last Pap smear: 08/2020 with Dr. HoPhilis Piqueast mammogram: 08/2020 Last colonoscopy: 01/2009, Dr. MaCollene Maresdue again 01/2014 but didn't have.  She called again in Sept/Oct 2020 to schedule, and found out she owes money. She has paid off half, hopes to be able to do within the next few months. Last DEXA: never Ophtho: yearly Dentist: she has gone, needs work done and needs to go back Exercise: Walking on the treadmill 3x/week x 30-45 minutes.  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 Doing some situps (2x/week). No weight-bearing exercise. Vitamin D level: 42 in 12/2015 (had been as low as 23 in the past) Lipids: Lab Results  Component Value Date   CHOL 176 06/18/2019   HDL 51 06/18/2019   LDLCALC 112 (H) 06/18/2019   TRIG 69 06/18/2019   CHOLHDL 3.5 06/18/2019  PMH, PSH, SH and FH were reviewed and updated  Outpatient Encounter Medications as of 07/20/2021  Medication Sig Note   Ascorbic Acid (VITAMIN C PO) Take by mouth. Reported on 10/13/2015 07/20/2021: Thinks 563m   atenolol (TENORMIN) 100 MG tablet TAKE 1 TABLET BY MOUTH EVERYDAY AT BEDTIME    FYAVOLV 1-5 MG-MCG TABS tablet Take 1 tablet by mouth daily.    Multiple Vitamins-Minerals (MULTI-VITAMIN GUMMIES PO) Take 1 each by mouth daily.      cetirizine  (ZYRTEC) 10 MG tablet Take 10 mg by mouth daily. (Patient not taking: Reported on 07/20/2021) 07/20/2021: As needed   No facility-administered encounter medications on file as of 07/20/2021.   No Known Allergies   ROS:  The patient denies anorexia, fever, headaches, vision changes, decreased hearing, ear pain, sore throat, breast concerns, palpitations, dizziness, syncope, cough, swelling, nausea, vomiting, heartburn, diarrhea, constipation, abdominal pain, melena, hematochezia, hematuria, incontinence, dysuria, irregular menstrual cycles, vaginal discharge, odor or itch, genital lesions, joint pains, numbness, tingling, weakness, tremor, suspicious skin lesions, depression, abnormal bleeding/bruising, or enlarged lymph nodes. Allergies are controlled with prn medications, not bothering her currently. On HRT--spotting stopped 2 months ago.  Rare hot flashes, mild.   PHYSICAL EXAM:  BP 124/80   Pulse 80   Ht 4' 10.5" (1.486 m)   Wt 170 lb 3.2 oz (77.2 kg)   LMP 12/07/2020   BMI 34.97 kg/m   Wt Readings from Last 3 Encounters:  07/20/21 170 lb 3.2 oz (77.2 kg)  06/03/21 170 lb (77.1 kg)  02/04/21 170 lb 9.6 oz (77.4 kg)   General Appearance:   Alert, cooperative, no distress, appears stated age.   Head:    Normocephalic, without obvious abnormality, atraumatic     Eyes:    PERRL, conjunctiva/corneas clear, EOM's intact, fundi benign     Ears:    Normal TM's and external ear canals     Nose:    Not examined, wearing mask due to COVID-19 pandemic  Throat:    Not examined, wearing mask due to COVID-19 pandemic  Neck:    Supple, no lymphadenopathy; thyroid: no enlargement/tenderness/nodules; no carotid bruit or JVD. Spine nontender.   Back:    Spine nontender, no curvature, ROM normal, no CVA tenderness     Lungs:    Clear to auscultation bilaterally without wheezes, rales or ronchi; respirations unlabored     Chest Wall:    No tenderness or deformity     Heart:    Regular rate and  rhythm, S1 and S2 normal, no murmur, rub or gallop     Breast Exam:    Deferred to GYN     Abdomen:    Soft, non-tender, nondistended, normoactive bowel sounds,   no masses, no hepatosplenomegaly     Genitalia:    Deferred to GYN           Extremities:    No clubbing, cyanosis or edema. There is some crepitus overlying the patella. Negative Lachman, McMurray. No effusion, warmth.  Some STS inferolaterally to knee (though was more overlying the patella when leg was straight). FROM of r knee  Pulses:    2+ and symmetric all extremities     Skin:    Skin color, texture, turgor normal, no rashes or lesions. Hyperpigmentation at right lateral lower leg (prior trauma) Birthmark at left anterior lower leg (lower shin, above ankle). No changes to skin.  Lymph nodes:    Cervical, supraclavicular, and axillary nodes normal  Neurologic:    Normal strength, sensation and gait; reflexes 2+ and symmetric throughout                             Psych:  Normal mood, affect, hygiene and grooming     ASSESSMENT/PLAN:  Annual physical exam - Plan: POCT Urinalysis DIP (Proadvantage Device), Comprehensive metabolic panel, Lipid panel, CBC with Differential/Platelet, HIV Antibody (routine testing w rflx), Hepatitis C antibody  H/O adenomatous polyp of colon - long past due for colonoscopy. Is working towards paying off balance with Dr. Collene Mares and hopes to schedule in the early part of 2023  Essential hypertension - Borderline; very good when gets adequate sleep, no stress. Cont atenolol 193m, low Na diet. encouraged exercise, wt loss - Plan: atenolol (TENORMIN) 100 MG tablet, Comprehensive metabolic panel, Lipid panel  Right knee pain, unspecified chronicity - check x-ray.  Reassured that popping/clicking without pain is ok.  f/u if painful, or locking, giving way. May use NSAID prn - Plan: DG Knee Complete 4 Views Right  BMI 34.0-34.9,adult - counseled on healthy diet, exercise, wt loss. Wt loss should help  with knee pain   C-met, CBC, lipid, HepC/HIV screening i   Discussed monthly self breast exams and yearly mammograms; at least 30 minutes of aerobic activity at least 5 days/week, weight-bearing exercise 2-3x/wk; proper sunscreen use reviewed; healthy diet, including goals of calcium and vitamin D intake and alcohol recommendations (less than or equal to 1 drink/day) reviewed; regular seatbelt use; changing batteries in smoke detectors.  Immunization recommendations discussed--continue yearly flu shots. Shingrix recommended, risks/side effects reviewed. Plans to return for NV when convenient. Colonoscopy was due in June 2015--she has paid off half of her balance, hopes to pay off the rest in the next few months and get her colonoscopy.   F/u 6 months, sooner prn.

## 2021-07-20 ENCOUNTER — Ambulatory Visit (INDEPENDENT_AMBULATORY_CARE_PROVIDER_SITE_OTHER): Payer: 59 | Admitting: Family Medicine

## 2021-07-20 ENCOUNTER — Other Ambulatory Visit: Payer: Self-pay

## 2021-07-20 ENCOUNTER — Encounter: Payer: Self-pay | Admitting: Family Medicine

## 2021-07-20 VITALS — BP 124/80 | HR 80 | Ht 58.5 in | Wt 170.2 lb

## 2021-07-20 DIAGNOSIS — Z86010 Personal history of colonic polyps: Secondary | ICD-10-CM | POA: Diagnosis not present

## 2021-07-20 DIAGNOSIS — M25561 Pain in right knee: Secondary | ICD-10-CM

## 2021-07-20 DIAGNOSIS — Z0000 Encounter for general adult medical examination without abnormal findings: Secondary | ICD-10-CM

## 2021-07-20 DIAGNOSIS — Z6834 Body mass index (BMI) 34.0-34.9, adult: Secondary | ICD-10-CM | POA: Diagnosis not present

## 2021-07-20 DIAGNOSIS — I10 Essential (primary) hypertension: Secondary | ICD-10-CM

## 2021-07-20 LAB — POCT URINALYSIS DIP (PROADVANTAGE DEVICE)
Blood, UA: NEGATIVE
Glucose, UA: NEGATIVE mg/dL
Ketones, POC UA: NEGATIVE mg/dL
Leukocytes, UA: NEGATIVE
Nitrite, UA: NEGATIVE
Protein Ur, POC: NEGATIVE mg/dL
Specific Gravity, Urine: 1.03
Urobilinogen, Ur: NEGATIVE
pH, UA: 6 (ref 5.0–8.0)

## 2021-07-20 MED ORDER — ATENOLOL 100 MG PO TABS: ORAL_TABLET | ORAL | 1 refills | Status: DC

## 2021-07-21 LAB — COMPREHENSIVE METABOLIC PANEL
ALT: 23 IU/L (ref 0–32)
AST: 29 IU/L (ref 0–40)
Albumin/Globulin Ratio: 0.9 — ABNORMAL LOW (ref 1.2–2.2)
Albumin: 4.2 g/dL (ref 3.8–4.9)
Alkaline Phosphatase: 62 IU/L (ref 44–121)
BUN/Creatinine Ratio: 15 (ref 9–23)
BUN: 13 mg/dL (ref 6–24)
Bilirubin Total: 0.5 mg/dL (ref 0.0–1.2)
CO2: 23 mmol/L (ref 20–29)
Calcium: 9.4 mg/dL (ref 8.7–10.2)
Chloride: 103 mmol/L (ref 96–106)
Creatinine, Ser: 0.84 mg/dL (ref 0.57–1.00)
Globulin, Total: 4.7 g/dL — ABNORMAL HIGH (ref 1.5–4.5)
Glucose: 82 mg/dL (ref 70–99)
Potassium: 4.3 mmol/L (ref 3.5–5.2)
Sodium: 139 mmol/L (ref 134–144)
Total Protein: 8.9 g/dL — ABNORMAL HIGH (ref 6.0–8.5)
eGFR: 83 mL/min/{1.73_m2} (ref >59)

## 2021-07-21 LAB — CBC WITH DIFFERENTIAL/PLATELET
Basophils Absolute: 0 10*3/uL (ref 0.0–0.2)
Basos: 1 %
EOS (ABSOLUTE): 0 10*3/uL (ref 0.0–0.4)
Eos: 1 %
Hematocrit: 41.8 % (ref 34.0–46.6)
Hemoglobin: 14.2 g/dL (ref 11.1–15.9)
Immature Grans (Abs): 0 10*3/uL (ref 0.0–0.1)
Immature Granulocytes: 0 %
Lymphocytes Absolute: 1.7 10*3/uL (ref 0.7–3.1)
Lymphs: 32 %
MCH: 30.1 pg (ref 26.6–33.0)
MCHC: 34 g/dL (ref 31.5–35.7)
MCV: 89 fL (ref 79–97)
Monocytes Absolute: 0.6 10*3/uL (ref 0.1–0.9)
Monocytes: 12 %
Neutrophils Absolute: 2.9 10*3/uL (ref 1.4–7.0)
Neutrophils: 54 %
Platelets: 239 10*3/uL (ref 150–450)
RBC: 4.72 x10E6/uL (ref 3.77–5.28)
RDW: 12.5 % (ref 11.7–15.4)
WBC: 5.3 10*3/uL (ref 3.4–10.8)

## 2021-07-21 LAB — HEPATITIS C ANTIBODY: Hep C Virus Ab: >11 s/co ratio — ABNORMAL HIGH (ref 0.0–0.9)

## 2021-07-21 LAB — HIV ANTIBODY (ROUTINE TESTING W REFLEX): HIV Screen 4th Generation wRfx: NONREACTIVE

## 2021-07-21 LAB — LIPID PANEL
Chol/HDL Ratio: 3.6 ratio (ref 0.0–4.4)
Cholesterol, Total: 204 mg/dL — ABNORMAL HIGH (ref 100–199)
HDL: 57 mg/dL (ref >39)
LDL Chol Calc (NIH): 137 mg/dL — ABNORMAL HIGH (ref 0–99)
Triglycerides: 53 mg/dL (ref 0–149)
VLDL Cholesterol Cal: 10 mg/dL (ref 5–40)

## 2021-07-21 NOTE — Progress Notes (Signed)
Patient has a +Hep C ab.  It doesn't look like this automatically reflexes to the viral load/HCV RNA test.  Please see if this can be added on to her labs (dx Hep C ab +), or schedule her to come in for that test (and enter future orders) if it cannot be added. Thanks

## 2021-07-22 ENCOUNTER — Other Ambulatory Visit: Payer: Self-pay | Admitting: *Deleted

## 2021-07-22 DIAGNOSIS — R768 Other specified abnormal immunological findings in serum: Secondary | ICD-10-CM

## 2021-07-23 ENCOUNTER — Other Ambulatory Visit: Payer: 59

## 2021-07-23 ENCOUNTER — Ambulatory Visit
Admission: RE | Admit: 2021-07-23 | Discharge: 2021-07-23 | Disposition: A | Discharge status: Home / Self Care | Payer: 59 | Source: Ambulatory Visit | Attending: Family Medicine | Admitting: Family Medicine

## 2021-07-23 ENCOUNTER — Other Ambulatory Visit: Payer: Self-pay

## 2021-07-23 DIAGNOSIS — M25561 Pain in right knee: Secondary | ICD-10-CM

## 2021-07-23 DIAGNOSIS — R768 Other specified abnormal immunological findings in serum: Secondary | ICD-10-CM

## 2021-07-25 LAB — HCV RNA BY PCR, QN RFX GENO
HCV Quant Baseline: 808000 IU/mL
HCV log10: 5.907 log10 IU/mL

## 2021-07-25 LAB — HEPATITIS C GENOTYPE

## 2021-07-27 ENCOUNTER — Other Ambulatory Visit: Payer: Self-pay | Admitting: *Deleted

## 2021-07-27 DIAGNOSIS — B1920 Unspecified viral hepatitis C without hepatic coma: Secondary | ICD-10-CM

## 2021-08-06 ENCOUNTER — Telehealth: Payer: Self-pay

## 2021-08-06 ENCOUNTER — Other Ambulatory Visit (HOSPITAL_COMMUNITY): Payer: Self-pay

## 2021-08-06 NOTE — Telephone Encounter (Signed)
[facility] Patient Teacher, English as a foreign language completed.    The patient is insured through [employer] .  Medication will need a PA.  We will continue to follow to see if copay assistance is needed.  Ileene Patrick, [facility] Patient [facility] Phone: [phone] Fax:  [phone]

## 2021-08-07 ENCOUNTER — Ambulatory Visit (INDEPENDENT_AMBULATORY_CARE_PROVIDER_SITE_OTHER): Payer: 59 | Admitting: Infectious Diseases

## 2021-08-07 ENCOUNTER — Other Ambulatory Visit: Payer: Self-pay

## 2021-08-07 VITALS — BP 130/73 | HR 69 | Resp 16 | Ht 61.0 in | Wt 173.6 lb

## 2021-08-07 DIAGNOSIS — B182 Chronic viral hepatitis C: Secondary | ICD-10-CM | POA: Diagnosis not present

## 2021-08-07 DIAGNOSIS — Z789 Other specified health status: Secondary | ICD-10-CM

## 2021-08-07 DIAGNOSIS — Z7185 Encounter for immunization safety counseling: Secondary | ICD-10-CM

## 2021-08-07 DIAGNOSIS — F1090 Alcohol use, unspecified, uncomplicated: Secondary | ICD-10-CM

## 2021-08-07 NOTE — Progress Notes (Addendum)
[facility] for Infectious Diseases                                      [address], [place], [place], [address]                                               Phn. [phone]; Fax: [phone]                                                               Date: 08/07/21 Reason for Visit: Hepatitis C    HPI: Amy Hill is a 55 y.o.old female with PMH as below who is referred for evaluation and management of Hepatitis C.   She tells she was recently tested positive for hep C during screening with her PCP on 12/15. Her husband has h/o hep C that was treated in the past and she is sexually active with him. She denies any known h/o IVD in her husband.   Denies h/o injectable or intranasal cocaine use, blood transfusion, sharing of toothbrushes/razors,  incarceration or Armed forces logistics/support/administrative officer.  No personal or family history of liver disease, Hepatitis or Liver cancer. has not received treatment to date  Denies any hospitalizations related to liver disease, jaundice, ascites, GI bleeding, mental status changes, abdominal pain and acholic stool.   ROS: Denies yellowish discoloration of sclera and skin, abdominal pain/distension, hematemesis.            Dcough, fever, chills, nightsweats, nausea, vomiting, diarrhea, constipation, weight loss, recent hospitalizations, rashes, joint complaints, shortness of breath, chest pain, headaches, dysuria .  Current Outpatient Medications on File Prior to Visit  Medication Sig Dispense Refill   Ascorbic Acid (VITAMIN C PO) Take by mouth. Reported on 10/13/2015     atenolol (TENORMIN) 100 MG tablet TAKE 1 TABLET BY MOUTH EVERYDAY AT BEDTIME 90 tablet 1   cetirizine (ZYRTEC) 10 MG tablet Take 10 mg by mouth daily.     FYAVOLV 1-5 MG-MCG TABS tablet Take 1 tablet by mouth daily.     Multiple Vitamins-Minerals (MULTI-VITAMIN GUMMIES PO) Take 1 each by mouth daily.       No current facility-administered  medications on file prior to visit.   No Known Allergies  Past Medical History:  Diagnosis Date   Adenomatous colon polyp 01/2009   Allergy    Contraceptive management    Frequent UTI    GERD (gastroesophageal reflux disease)    Migraine    Uterine fibroid    Dr. Philis Pique   Past Surgical History:  Procedure Laterality Date   CESAREAN SECTION     COLONOSCOPY  01/10/2009   tubular adenoma (Dr. Collene Mares); repeat every 5 years   Social History   Socioeconomic History   Marital status: Married    Spouse name: Not on file   Number of children: 1   Years of education: Not on file   Highest education level: Not on file  Occupational History   Occupation: Product manager: [employer]  Tobacco Use   Smoking status: Never  Smokeless tobacco: Never  Vaping Use   Vaping Use: Never used  Substance and Sexual Activity   Alcohol use: Yes    Comment: 1/week or less   Drug use: No   Sexual activity: Yes    Partners: Male    Birth control/protection: Pill  Other Topics Concern   Not on file  Social History Narrative   Lives with husband (used to be out of town a lot, Administrator) and 1 daughter. Has 3 stepsons, one moved back home, is working. (All in [place])   Got a [place] 2018.    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   Husband diagnosed with stage 4 prostate cancer and kidney disease 12/2017.   06/2019 husband on dialysis (doing well, got part-time job teaching truck driving)      Updated 07/2021   Social Determinants of Health   Financial Resource Strain: Not on file  Food Insecurity: Not on file  Transportation Needs: Not on file  Physical Activity: Not on file  Stress: Not on file  Social Connections: Not on file  Intimate Partner Violence: Not on file   Family History  Problem Relation Age of Onset   Hyperlipidemia Mother    Hypertension Mother    Colon polyps Father    Hyperlipidemia Father     Hypertension Father    Gout Father    Hypertension Brother    Heart disease Maternal Grandfather    Cancer Neg Hx    Diabetes Neg Hx   '  Vitals  BP 130/73    Pulse 69    Resp 16    Ht 5\' 1"  (1.549 m)    Wt 173 lb 9.6 oz (78.7 kg)    SpO2 98%    BMI 32.80 kg/m    Gen: Alert and oriented x 3, no acute distress HEENT: [place]/AT,  no scleral icterus, no pale conjunctivae, hearing normal, oral mucosa moist Neck: Supple, no lymphadenopathy Cardio: Regular rate and rhythm; +S1 and S2 Resp: CTAB GI: Soft, nontender, nondistended, bowel sounds present GU: Musc: Extremities: No cyanosis, clubbing, or edema Skin: No rashes, lesions, or ecchymoses Neuro: No focal deficits Psych: Calm, cooperative   Laboratory  CBC Latest Ref Rng & Units 07/20/2021 03/10/2020 06/18/2019  WBC 3.4 - 10.8 x10E3/uL 5.3 5.9 7.5  Hemoglobin 11.1 - 15.9 g/dL 14.2 13.4 13.1  Hematocrit 34.0 - 46.6 % 41.8 41.1 39.8  Platelets 150 - 450 x10E3/uL 239 291 305   CMP Latest Ref Rng & Units 07/20/2021 03/10/2020 06/18/2019  Glucose 70 - 99 mg/dL 82 83 85  BUN 6 - 24 mg/dL 13 10 11   Creatinine 0.57 - 1.00 mg/dL 0.84 0.76 0.75  Sodium 134 - 144 mmol/L 139 136 137  Potassium 3.5 - 5.2 mmol/L 4.3 4.6 4.2  Chloride 96 - 106 mmol/L 103 101 103  CO2 20 - 29 mmol/L 23 19(L) 20  Calcium 8.7 - 10.2 mg/dL 9.4 9.4 9.0  Total Protein 6.0 - 8.5 g/dL 8.9(H) - 7.9  Total Bilirubin 0.0 - 1.2 mg/dL 0.5 - 0.4  Alkaline Phos 44 - 121 IU/L 62 - 58  AST 0 - 40 IU/L 29 - 32  ALT 0 - 32 IU/L 23 - 28   Problem List Items Addressed This Visit       Digestive   Chronic hepatitis C without hepatic coma (HCC) - Primary   Relevant Orders   Hepatitis B core antibody, total   Hepatitis B surface antibody,qualitative  Hepatitis B surface antigen   US ABDOMEN COMPLETE W/ELASTOGRAPHY   Liver Fibrosis, FibroTest-ActiTest   Hepatitis A antibody, total   Protime-INR     Other   Immunization counseling   Alcohol use     Assessment/Plan: Chronic Hepatitis C Prior treatment: none  GT: 1b Evidence of cirrhosis: [place] and fibrotest ordered  Interested in treatment: yes  Potential DDI - will check based on anti viral to be used pending labs and [place] Following tests today Fu in 3-4 weeks for treatment planning Orders Placed This Encounter  Procedures   US ABDOMEN COMPLETE W/ELASTOGRAPHY   Hepatitis B core antibody, total   Hepatitis B surface antibody,qualitative   Hepatitis B surface antigen   Liver Fibrosis, FibroTest-ActiTest   Hepatitis A antibody, total   Protime-INR    Alcohol use Drinks alcohol occasionally  Counseled   Immunization counseling  Has received Flu vaccine   Counseling done on the following -Natural progression of hep c, transmission (avoid sharing personal hygiene equipment), prevention, risks of left untreated and treatment options  -Avoid hepatotoxins like alcohol and excessive acetamaminphen (no more than 2 gram a day) -Avoid eating raw sea food -Risks of re-infection  -Hepatitis coinfection and vaccination   I have personally spent more than 70 minutes involved in face-to-face and non-face-to-face activities for this patient on the day of the visit. Professional time spent includes the following activities: Preparing to see the patient (review of tests), Obtaining and/or reviewing separately obtained history (admission/discharge record), Performing a medically appropriate examination and/or evaluation , Ordering medications/tests/procedures, referring and communicating with other health care professionals, Documenting clinical information in the EMR, Independently interpreting results (not separately reported), Communicating results to the patient/family/caregiver, Counseling and educating the patient/family/caregiver and Care coordination (not separately reported).   Patients questions were addressed and answered.   Electronically signed by:  Rosiland Oz,  MD Infectious Diseases  Office phone [phone] Fax no. [phone]

## 2021-08-13 ENCOUNTER — Other Ambulatory Visit: Payer: Self-pay

## 2021-08-13 ENCOUNTER — Ambulatory Visit (HOSPITAL_COMMUNITY)
Admission: RE | Admit: 2021-08-13 | Discharge: 2021-08-13 | Disposition: A | Discharge status: Home / Self Care | Payer: 59 | Source: Ambulatory Visit | Attending: Infectious Diseases | Admitting: Infectious Diseases

## 2021-08-13 DIAGNOSIS — B182 Chronic viral hepatitis C: Secondary | ICD-10-CM | POA: Insufficient documentation

## 2021-08-13 LAB — LIVER FIBROSIS, FIBROTEST-ACTITEST
ALT: 19 U/L (ref 6–29)
Alpha-2-Macroglobulin: 323 mg/dL — ABNORMAL HIGH (ref 106–279)
Apolipoprotein A1: 180 mg/dL (ref 101–198)
Bilirubin: 0.4 mg/dL (ref 0.2–1.2)
Fibrosis Score: 0.27
GGT: 29 U/L (ref 3–70)
Haptoglobin: 105 mg/dL (ref 43–212)
Necroinflammat ACT Score: 0.07
Reference ID: 4171404

## 2021-08-13 LAB — HEPATITIS B CORE ANTIBODY, TOTAL: Hep B Core Total Ab: NONREACTIVE

## 2021-08-13 LAB — PROTIME-INR
INR: 1
Prothrombin Time: 10.1 s (ref 9.0–11.5)

## 2021-08-13 LAB — HEPATITIS B SURFACE ANTIGEN: Hepatitis B Surface Ag: NONREACTIVE

## 2021-08-13 LAB — HEPATITIS A ANTIBODY, TOTAL: Hepatitis A AB,Total: NONREACTIVE

## 2021-08-13 LAB — HEPATITIS B SURFACE ANTIBODY,QUALITATIVE: Hep B S Ab: NONREACTIVE

## 2021-08-14 ENCOUNTER — Telehealth: Payer: Self-pay

## 2021-08-14 ENCOUNTER — Other Ambulatory Visit (HOSPITAL_COMMUNITY): Payer: Self-pay

## 2021-08-14 NOTE — Telephone Encounter (Signed)
[facility] Patient Advocate Encounter   Received notification from [facility] that prior authorization for [place] is required.   PA submitted on 08/14/21 Key [id number] Status is pending    [facility] will continue to follow.   Ileene Patrick, [facility] Patient [facility] for Infectious Disease Phone: [phone] Fax:  [phone]

## 2021-08-19 ENCOUNTER — Telehealth: Payer: Self-pay

## 2021-08-19 ENCOUNTER — Other Ambulatory Visit (HOSPITAL_COMMUNITY): Payer: Self-pay

## 2021-08-19 NOTE — Telephone Encounter (Signed)
[facility] Patient Advocate Encounter   Received notification from [employer] that prior authorization for Amy Hill is required.   PA submitted on 08/19/21 Key [id number] Status is pending    [facility] will continue to follow.   Ileene Patrick, [facility] Patient [facility] for Infectious Disease Phone: [phone] Fax:  [phone]

## 2021-08-20 ENCOUNTER — Other Ambulatory Visit (HOSPITAL_COMMUNITY): Payer: Self-pay

## 2021-08-20 ENCOUNTER — Telehealth: Payer: Self-pay

## 2021-08-20 NOTE — Telephone Encounter (Addendum)
[facility] Patient Advocate Encounter  Prior Authorization for Amy Hill has been approved.    PA# [id number] Effective dates: 08/20/21 through 11/11/21  Prescription will need to be filled at [facility] # [phone].  Copay coupon card        [facility] will continue to follow.  Ileene Patrick, [facility] Patient [facility] for Infectious Disease Phone: [phone] Fax:  [phone]

## 2021-08-25 ENCOUNTER — Telehealth: Payer: Self-pay

## 2021-08-25 ENCOUNTER — Other Ambulatory Visit: Payer: Self-pay | Admitting: Pharmacist

## 2021-08-25 DIAGNOSIS — B182 Chronic viral hepatitis C: Secondary | ICD-10-CM

## 2021-08-25 MED ORDER — SOFOSBUVIR-VELPATASVIR 400-100 MG PO TABS
1.0000 | ORAL_TABLET | Freq: Every day | ORAL | 2 refills | Status: DC
Start: 2021-08-25 — End: 2021-12-02

## 2021-08-25 NOTE — Telephone Encounter (Signed)
I called Amy Hill to discuss her new treatment for Hepatitis C. She will be starting on Epclusa.   Patient is approved to receive Epclusa x 12 weeks for chronic Hepatitis C infection. Counseled patient to take Epclusa daily with or without food. Encouraged patient not to miss any doses and explained how their chance of cure could go down with each dose missed. Counseled patient on what to do if dose is missed - if it is closer to the missed dose take immediately; if closer to next dose skip dose and take the next dose at the usual time. Counseled patient on common side effects such as headache, fatigue, and nausea and that these normally decrease with time. I reviewed patient medications and found no drug interactions. Discussed with patient that there are several drug interactions including acid suppressants. Instructed patient to call clinic if she wishes to start a new medication during course of therapy. Also advised patient to call if she experiences any side effects. Patient will follow-up with Dr. West Bali in the pharmacy clinic on 09/08/21 @ 8:45. She has not heard from [facility] yet so I provided her with pharmacy phone [place], PharmD PGY2 Infectious Diseases Pharmacy Resident   Please check [email or web address] for unit-specific pharmacy phone numbers

## 2021-09-08 ENCOUNTER — Ambulatory Visit: Payer: 59 | Admitting: Infectious Diseases

## 2021-09-08 ENCOUNTER — Telehealth: Payer: Self-pay

## 2021-09-08 NOTE — Telephone Encounter (Signed)
Received fax from [place] notes mentioned mammo done 09/08/21. Sending request for that mammo.

## 2021-09-09 ENCOUNTER — Telehealth: Payer: Self-pay | Admitting: Family Medicine

## 2021-09-09 DIAGNOSIS — I10 Essential (primary) hypertension: Secondary | ICD-10-CM

## 2021-09-09 MED ORDER — ATENOLOL 100 MG PO TABS: ORAL_TABLET | ORAL | 0 refills | Status: DC

## 2021-09-09 NOTE — Telephone Encounter (Signed)
Pt called and states that she has a NEW pharmacy. She would like her rx for atenolol sent to [facility]. She would like 90 day supply. Pt can be reached at [phone].

## 2021-09-09 NOTE — Telephone Encounter (Signed)
Rx sent to [facility] and patient advised.

## 2021-10-02 ENCOUNTER — Other Ambulatory Visit: Payer: Self-pay

## 2021-10-02 ENCOUNTER — Encounter: Payer: Self-pay | Admitting: Infectious Diseases

## 2021-10-02 ENCOUNTER — Ambulatory Visit (INDEPENDENT_AMBULATORY_CARE_PROVIDER_SITE_OTHER): Payer: 59 | Admitting: Infectious Diseases

## 2021-10-02 VITALS — BP 159/89 | HR 82 | Temp 98.2°F | Ht 61.0 in | Wt 174.0 lb

## 2021-10-02 DIAGNOSIS — Z23 Encounter for immunization: Secondary | ICD-10-CM

## 2021-10-02 DIAGNOSIS — Z5181 Encounter for therapeutic drug level monitoring: Secondary | ICD-10-CM | POA: Diagnosis not present

## 2021-10-02 DIAGNOSIS — B182 Chronic viral hepatitis C: Secondary | ICD-10-CM | POA: Diagnosis not present

## 2021-10-02 NOTE — Progress Notes (Signed)
[facility] for Infectious Diseases                                      [address], [place], [place], [address]                                               Phn. [phone]; Fax: [phone]                                                               Date: 10/03/21 Reason for Visit: Hepatitis C Follow Up   HPI: MIKELLA Hill is a 56 y.o.old female with PMH as below who is here for follow up for hepatitis C.   Started taking Epclusa fromJan 24, missed only one pill. No new medications.  Denies any issues with Epclusa and tolerating well Wants to get hep A vaccine Discussed plan for follow up  ROS: Denies yellowish discoloration of sclera and skin, abdominal pain/distension, hematemesis.            Denies fever, chills, nightsweats, nausea, vomiting, diarrhea, constipation, weight loss, recent hospitalizations, rashes, joint complaints, shortness of breath, chest pain, headaches, dysuria .  Current Outpatient Medications on File Prior to Visit  Medication Sig Dispense Refill   Ascorbic Acid (VITAMIN C PO) Take by mouth. Reported on 10/13/2015     atenolol (TENORMIN) 100 MG tablet TAKE 1 TABLET BY MOUTH EVERYDAY AT BEDTIME 90 tablet 0   FYAVOLV 1-5 MG-MCG TABS tablet Take 1 tablet by mouth daily.     Multiple Vitamins-Minerals (MULTI-VITAMIN GUMMIES PO) Take 1 each by mouth daily.       Sofosbuvir-Velpatasvir (EPCLUSA) 400-100 MG TABS Take 1 tablet by mouth daily. 28 tablet 2   No current facility-administered medications on file prior to visit.    No Known Allergies  Past Medical History:  Diagnosis Date   Adenomatous colon polyp 01/2009   Allergy    Contraceptive management    Frequent UTI    GERD (gastroesophageal reflux disease)    Migraine    Uterine fibroid    Dr. Philis Pique   Past Surgical History:  Procedure Laterality Date   CESAREAN SECTION     COLONOSCOPY  01/10/2009   tubular adenoma (Dr. Collene Mares); repeat every  5 years   Social History   Socioeconomic History   Marital status: Married    Spouse name: Not on file   Number of children: 1   Years of education: Not on file   Highest education level: Not on file  Occupational History   Occupation: Product manager: [employer]  Tobacco Use   Smoking status: Never   Smokeless tobacco: Never  Vaping Use   Vaping Use: Never used  Substance and Sexual Activity   Alcohol use: Yes    Comment: 1/week or less   Drug use: No   Sexual activity: Yes    Partners: Male    Birth control/protection: Pill  Other Topics Concern   Not on file  Social History Narrative   Lives with husband (  used to be out of town a lot, Administrator) and 1 daughter. Has 3 stepsons, one moved back home, is working. (All in [place])   Got a [place] 2018.    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   Husband diagnosed with stage 4 prostate cancer and kidney disease 12/2017.   06/2019 husband on dialysis (doing well, got part-time job teaching truck driving)      Updated 07/2021   Social Determinants of Health   Financial Resource Strain: Not on file  Food Insecurity: Not on file  Transportation Needs: Not on file  Physical Activity: Not on file  Stress: Not on file  Social Connections: Not on file  Intimate Partner Violence: Not on file   Family History  Problem Relation Age of Onset   Hyperlipidemia Mother    Hypertension Mother    Colon polyps Father    Hyperlipidemia Father    Hypertension Father    Gout Father    Hypertension Brother    Heart disease Maternal Grandfather    Cancer Neg Hx    Diabetes Neg Hx   '  Vitals  BP (!) 159/89 Comment: Has not yet taken BP medication today   Pulse 82    Temp 98.2 F (36.8 C) (Oral)    Ht 5\' 1"  (1.549 m)    Wt 174 lb (78.9 kg)    SpO2 100%    BMI 32.88 kg/m    Gen: Alert and oriented x 3, no acute distress HEENT: [place]/AT,  no scleral icterus, no pale  conjunctivae, hearing normal, oral mucosa moist Neck: Supple Cardio: Regular rate and rhythm Resp: Pulmonary effort normal on room air GI: Soft, nontender, nondistended GU: Musc: Extremities: No cyanosis, clubbing, or edema Neuro: Grossly non focal  Psych: Calm, cooperative  Laboratory  CBC Latest Ref Rng & Units 07/20/2021 03/10/2020 06/18/2019  WBC 3.4 - 10.8 x10E3/uL 5.3 5.9 7.5  Hemoglobin 11.1 - 15.9 g/dL 14.2 13.4 13.1  Hematocrit 34.0 - 46.6 % 41.8 41.1 39.8  Platelets 150 - 450 x10E3/uL 239 291 305   CMP Latest Ref Rng & Units 08/07/2021 07/20/2021 03/10/2020  Glucose 70 - 99 mg/dL - 82 83  BUN 6 - 24 mg/dL - 13 10  Creatinine 0.57 - 1.00 mg/dL - 0.84 0.76  Sodium 134 - 144 mmol/L - 139 136  Potassium 3.5 - 5.2 mmol/L - 4.3 4.6  Chloride 96 - 106 mmol/L - 103 101  CO2 20 - 29 mmol/L - 23 19(L)  Calcium 8.7 - 10.2 mg/dL - 9.4 9.4  Total Protein 6.0 - 8.5 g/dL - 8.9(H) -  Total Bilirubin 0.0 - 1.2 mg/dL - 0.5 -  Alkaline Phos 44 - 121 IU/L - 62 -  AST 0 - 40 IU/L - 29 -  ALT 6 - 29 U/L 19 23 -   Problem List Items Addressed This Visit       Digestive   Chronic hepatitis C without hepatic coma (HCC) - Primary   Relevant Orders   Hepatitis C RNA quantitative     Other   Need for hepatitis A immunization   Relevant Orders   Hepatitis A vaccine adult IM (Completed)   Medication monitoring encounter    Assessment/Plan: # Chronic Hepatitis C - Complete 12 weeks course of Epclusa - Fu in 8 weeks, end of tx  # Medication Monitoring  - HCV RNA today   # Immunization counseling  - Hepatitis A vaccine #1  I have personally spent 40 minutes involved in face-to-face and non-face-to-face activities for this patient on the day of the visit.   Patients questions were addressed and answered.   Electronically signed by: Rosiland Oz, MD Infectious Diseases  Office phone [phone] Fax no. [phone]

## 2021-10-03 DIAGNOSIS — Z23 Encounter for immunization: Secondary | ICD-10-CM | POA: Insufficient documentation

## 2021-10-03 DIAGNOSIS — Z5181 Encounter for therapeutic drug level monitoring: Secondary | ICD-10-CM | POA: Insufficient documentation

## 2021-10-04 LAB — HEPATITIS C RNA QUANTITATIVE
HCV Quantitative Log: <1.18 log IU/mL
HCV RNA, PCR, QN: <15 IU/mL

## 2021-12-02 ENCOUNTER — Encounter: Payer: Self-pay | Admitting: Infectious Diseases

## 2021-12-02 ENCOUNTER — Other Ambulatory Visit: Payer: Self-pay

## 2021-12-02 ENCOUNTER — Ambulatory Visit (INDEPENDENT_AMBULATORY_CARE_PROVIDER_SITE_OTHER): Payer: 59 | Admitting: Infectious Diseases

## 2021-12-02 VITALS — BP 138/78 | HR 86 | Temp 97.5°F | Wt 178.0 lb

## 2021-12-02 DIAGNOSIS — Z7185 Encounter for immunization safety counseling: Secondary | ICD-10-CM

## 2021-12-02 DIAGNOSIS — B182 Chronic viral hepatitis C: Secondary | ICD-10-CM

## 2021-12-02 DIAGNOSIS — Z23 Encounter for immunization: Secondary | ICD-10-CM

## 2021-12-02 DIAGNOSIS — Z5181 Encounter for therapeutic drug level monitoring: Secondary | ICD-10-CM

## 2021-12-02 NOTE — Progress Notes (Addendum)
[facility]  ?                                    [address], [place], [place], [address] ?                                              Phn. [phone]; Fax: [phone] ?                                                              Date: 12/02/21 ? ?Reason for Visit: Hepatitis C Follow Up ? ? ?HPI: Amy Hill is a 56 y.o.old female with PMH as below who is here for follow up for end of tx fu for hepatitis C.  ? ?Started taking Epclusa from Jan 24, missed only one pill. Last dose of epclusa 4/18.  ?Denies any issues with Epclusa  ?Wants to get Hep B vaccine ?Discussed her US abdomen findings ?Discussed plan for follow up in 3 months  ? ?ROS: negative except as stated  ? ?Current Outpatient Medications on File Prior to Visit  ?Medication Sig Dispense Refill  ? Ascorbic Acid (VITAMIN C PO) Take by mouth. Reported on 10/13/2015    ? atenolol (TENORMIN) 100 MG tablet TAKE 1 TABLET BY MOUTH EVERYDAY AT BEDTIME 90 tablet 0  ? FYAVOLV 1-5 MG-MCG TABS tablet Take 1 tablet by mouth daily.    ? Multiple Vitamins-Minerals (MULTI-VITAMIN GUMMIES PO) Take 1 each by mouth daily.      ? ?No current facility-administered medications on file prior to visit.  ? ?No Known Allergies ? ?Past Medical History:  ?Diagnosis Date  ? Adenomatous colon polyp 01/2009  ? Allergy   ? Contraceptive management   ? Frequent UTI   ? GERD (gastroesophageal reflux disease)   ? Migraine   ? Uterine fibroid   ? Dr. Philis Pique  ? ?Past Surgical History:  ?Procedure Laterality Date  ? CESAREAN SECTION    ? COLONOSCOPY  01/10/2009  ? tubular adenoma (Dr. Collene Mares); repeat every 5 years  ? ?Social History  ? ?Socioeconomic History  ? Marital status: Married  ?  Spouse name: Not on file  ? Number of children: 1  ? Years of education: Not on file  ? Highest education level: Not on file  ?Occupational History  ? Occupation: payroll/accounting  ?  Employer: [employer]  ?Tobacco  Use  ? Smoking status: Never  ? Smokeless tobacco: Never  ?Vaping Use  ? Vaping Use: Never used  ?Substance and Sexual Activity  ? Alcohol use: Not Currently  ?  Comment: 1/week or less  ? Drug use: No  ? Sexual activity: Yes  ?  Partners: Male  ?  Birth control/protection: Pill  ?Other Topics Concern  ? Not on file  ?Social History Narrative  ? Lives with husband (used to be out of town a lot, Administrator) and 1 daughter. Has 3 stepsons, one moved back home, is working. (All in [place])  ? Got a [place] 2018.   ? 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  ? Husband diagnosed with stage 4 prostate cancer and kidney disease 12/2017.  ? 06/2019 husband on dialysis (doing well, got part-time job teaching truck driving)  ?   ? Updated 07/2021  ? ?Social Determinants of Health  ? ?Financial Resource Strain: Not on file  ?Food Insecurity: Not on file  ?Transportation Needs: Not on file  ?Physical Activity: Not on file  ?Stress: Not on file  ?Social Connections: Not on file  ?Intimate Partner Violence: Not on file  ? ?Family History  ?Problem Relation Age of Onset  ? Hyperlipidemia Mother   ? Hypertension Mother   ? Colon polyps Father   ? Hyperlipidemia Father   ? Hypertension Father   ? Gout Father   ? Hypertension Brother   ? Heart disease Maternal Grandfather   ? Cancer Neg Hx   ? Diabetes Neg Hx   ?' ? ?Vitals  ?BP 138/78   Pulse 86   Temp (!) 97.5 ?F (36.4 ?C) (Oral)   Wt 178 lb (80.7 kg)   BMI 33.63 kg/m?  ? ? ? ?Gen: Alert and oriented x 3, no acute distress ?HEENT: [place]/AT,  no scleral icterus, no pale conjunctivae, hearing normal, oral mucosa moist ?Neck: Supple ?Cardio: Regular rate and rhythm ?Resp: Pulmonary effort normal on room air ?GI: Soft, nontender, nondistended ?GU: ?Musc: ?Extremities: No pedal edema ?Neuro: Grossly non focal  ?Psych: Calm, cooperative ? ?Laboratory  ? ?  Latest Ref Rng & Units 07/20/2021  ? 10:45 AM 03/10/2020  ?  2:05 PM 06/18/2019  ? 10:44 AM  ?CBC   ?WBC 3.4 - 10.8 x10E3/uL 5.3   5.9   7.5    ?Hemoglobin 11.1 - 15.9 g/dL 14.2   13.4   13.1    ?Hematocrit 34.0 - 46.6 % 41.8   41.1   39.8    ?Platelets 150 - 450 x10E3/uL 239   291   305    ? ? ?  Latest Ref Rng & Units 08/07/2021  ? 10:23 AM 07/20/2021  ? 10:45 AM 03/10/2020  ?  2:05 PM  ?CMP  ?Glucose 70 - 99 mg/dL  82   83    ?BUN 6 - 24 mg/dL  13   10    ?Creatinine 0.57 - 1.00 mg/dL  0.84   0.76    ?Sodium 134 - 144 mmol/L  139   136    ?Potassium 3.5 - 5.2 mmol/L  4.3   4.6    ?Chloride 96 - 106 mmol/L  103   101    ?CO2 20 - 29 mmol/L  23   19    ?Calcium 8.7 - 10.2 mg/dL  9.4   9.4    ?Total Protein 6.0 - 8.5 g/dL  8.9     ?Total Bilirubin 0.0 - 1.2 mg/dL  0.5     ?Alkaline Phos 44 - 121 IU/L  62     ?AST 0 - 40 IU/L  29     ?ALT 6 - 29 U/L 19   23     ? ?Imaging ?US abdomen 08/13/21 ?FINDINGS: ?ULTRASOUND ABDOMEN ?  ?Gallbladder: 4 mm gallstone. No pericholecystic fluid or wall ?thickening visualized. No sonographic Murphy sign noted by ?sonographer. ?  ?Common bile duct: Diameter: 2 mm ?  ?Liver: Diffusely increased parenchymal echogenicity. Simple ?appearing 8 mm hepatic cyst in the right lobe. Echogenic focus along ?the peripheral right lobe of the liver which demonstrates posterior ?shadowing, most consistent with a  calcified granuloma. Portal vein ?is patent on color Doppler imaging with normal direction of blood ?flow towards the liver. ?  ?IVC: No abnormality visualized. ?  ?Pancreas: Visualized portion unremarkable. ?  ?Spleen: Size and appearance within normal limits. ?  ?Right Kidney: Length: 9.1 cm. Echogenicity within normal limits. No ?mass or hydronephrosis visualized. ?  ?Left Kidney: Length: 10.3 cm. Echogenicity within normal limits. No ?mass or hydronephrosis visualized. ?  ?Abdominal aorta: No aneurysm visualized. ?  ?Other findings: None. ?  ?ULTRASOUND HEPATIC ELASTOGRAPHY ?  ?Device: Siemens Helix VTQ ?  ?Patient position: Oblique ?  ?Transducer 5C1 ?  ?Number of measurements: 10 ?   ?Hepatic segment:  8 ?  ?Median kPa: 3.2 ?  ?IQR: 0.5 ?  ?IQR/Median kPa ratio: 0.2 ?  ?Data quality:  Good ?  ?Diagnostic category:  < or = 5 kPa: high probability of being normal ?  ?The use of hepatic elastography is applicable to patients with viral ?hepatitis and non-alcoholic fatty liver disease. At this time, there ?is insufficient data for the referenced cut-off values and use in ?other causes of liver disease, including alcoholic liver disease. ?Patients, however, may be assessed by elastography and serve as ?their own reference standard/baseline. ?  ?In patients with non-alcoholic liver disease, the values suggesting ?compensated advanced chronic liver disease (cACLD) may be lower, and ?patients may need additional testing with elasticity results of 7-9 ?kPa. ?  ?Please note that abnormal hepatic elasticity and shear wave ?velocities may also be identified in clinical settings other than ?with hepatic fibrosis, such as: acute hepatitis, elevated right ?heart and central venous pressures including use of beta blockers, ?veno-occlusive disease (Budd-Chiari), infiltrative processes such as ?mastocytosis/amyloidosis/infiltrative tumor/lymphoma, extrahepatic ?cholestasis, with hyperemia in the post-prandial state, and with ?liver transplantation. Correlation with patient history, laboratory ?data, and clinical condition recommended. ?  ?Diagnostic Categories: ?  ?< or =5 kPa: high probability of being normal ?  ?< or =9 kPa: in the absence of other known clinical signs, rules out ?cACLD ?  ?>9 kPa and ?13 kPa: suggestive of cACLD, but needs further testing ?  ?>13 kPa: highly suggestive of cACLD ?  ?> or =17 kPa: highly suggestive of cACLD with an increased ?probability of clinically significant portal hypertension ?  ?IMPRESSION: ?ULTRASOUND ABDOMEN: ?  ?The hepatic parenchyma is increased in echogenicity, nonspecific ?most commonly reflecting hepatic steatosis or hepatocellular ?disease. ?  ?ULTRASOUND  HEPATIC ELASTOGRAPHY: ?  ?Median kPa:  3.2 ?  ?Diagnostic category:  < or = 5 kPa: high probability of being normal ? ?  ?Immunization History  ?Administered Date(s) Administered  ? Hepatitis A, Adult 10/02/2021

## 2021-12-06 LAB — HEPATITIS C RNA QUANTITATIVE
HCV Quantitative Log: <1.18 log IU/mL
HCV RNA, PCR, QN: <15 IU/mL

## 2021-12-07 NOTE — Progress Notes (Signed)
Patient made aware via Mychart.

## 2022-01-28 ENCOUNTER — Encounter: Payer: 59 | Admitting: Family Medicine

## 2022-02-03 NOTE — Progress Notes (Unsigned)
  No chief complaint on file.   Patient presents for 6 month follow-up.  She was diagnosed with hepatitis C upon routine screening at her physical in December.  She is followed in the ID clinic, and treated with Epclusa. Viral load was negative in February and April. Got Hep A vaccine in 09/2021, will be due for 2nd dose in August. Got Hep B vaccine 11/2021 (Heplisav) --2nd dose is usually 4 weeks later.  Scheduled??  Hypertension:  She is compliant with taking '100mg'$  of atenolol daily.  She is tolerating this without side effects.  She has been exercising daily, and is trying to follow low sodium diet. She has occasional headaches (related to stressful day at work); denies dizziness, chest pain, shortness of breath, edema (just sock marks). BP's have been running  BP Readings from Last 3 Encounters:  12/02/21 138/78  10/02/21 (!) 159/89  08/07/21 130/73    Husband has stage 4 prostate cancer, treated with Lupron injections q6 mos (not a candidate for chemo or radiation due to his kidney disease, per pt). Tumors caused hydronephrosis, has stents, back pain.  Has been on dialysis since 06/2019. He is working a part-time job (truck Press photographer in [place]). He continues to do well.      ROS: no fever, chills, URI symptoms, chest pain, shortness of breath, edema, dizziness, syncope. Occasional headache. No nausea, vomiting, bowel changes, bleeding, bruising, rash   PHYSICAL EXAM:  There were no vitals taken for this visit.  Wt Readings from Last 3 Encounters:  12/02/21 178 lb (80.7 kg)  10/02/21 174 lb (78.9 kg)  08/07/21 173 lb 9.6 oz (78.7 kg)      ASSESSMENT/PLAN:  ?compliant with atenolol? (Per refills, should have run out in May) Need RF??

## 2022-02-04 ENCOUNTER — Encounter: Payer: Self-pay | Admitting: Family Medicine

## 2022-02-04 ENCOUNTER — Ambulatory Visit: Payer: 59 | Admitting: Family Medicine

## 2022-02-04 VITALS — BP 120/70 | HR 72 | Ht 59.0 in | Wt 175.0 lb

## 2022-02-04 DIAGNOSIS — I10 Essential (primary) hypertension: Secondary | ICD-10-CM | POA: Diagnosis not present

## 2022-02-04 DIAGNOSIS — B182 Chronic viral hepatitis C: Secondary | ICD-10-CM

## 2022-02-04 DIAGNOSIS — K8020 Calculus of gallbladder without cholecystitis without obstruction: Secondary | ICD-10-CM | POA: Diagnosis not present

## 2022-02-04 MED ORDER — ATENOLOL 100 MG PO TABS: ORAL_TABLET | ORAL | 1 refills | Status: DC

## 2022-02-08 ENCOUNTER — Encounter: Payer: Self-pay | Admitting: Family Medicine

## 2022-03-03 ENCOUNTER — Encounter: Payer: Self-pay | Admitting: Infectious Diseases

## 2022-03-03 ENCOUNTER — Other Ambulatory Visit: Payer: Self-pay

## 2022-03-03 ENCOUNTER — Ambulatory Visit (INDEPENDENT_AMBULATORY_CARE_PROVIDER_SITE_OTHER): Payer: 59 | Admitting: Infectious Diseases

## 2022-03-03 VITALS — BP 130/74 | HR 85 | Temp 97.7°F | Wt 174.0 lb

## 2022-03-03 DIAGNOSIS — B182 Chronic viral hepatitis C: Secondary | ICD-10-CM

## 2022-03-03 DIAGNOSIS — Z23 Encounter for immunization: Secondary | ICD-10-CM | POA: Diagnosis not present

## 2022-03-03 DIAGNOSIS — Z7185 Encounter for immunization safety counseling: Secondary | ICD-10-CM | POA: Diagnosis not present

## 2022-03-03 NOTE — Progress Notes (Signed)
[facility]                                      [address], [place], [place], [address]                                               Phn. [phone]; Fax: [phone]                                                               Date: 03/03/22  Reason for Visit: Hepatitis C Follow Up  HPI: Amy Hill is a 56 y.o.old female with PMH as below who is here for follow up for SVR check for Hepatitis C. Doing well with no concerns. She is still unclear regarding how she got hepatitis C. She is willing to get second dose of hepatitis B vaccine today and Hepatitis A vaccine late August/early sept 2023. She is also following with PCP for Hypertension.   ROS: All systems reviewed with pertinent positives and negative except as stated above  Current Outpatient Medications on File Prior to Visit  Medication Sig Dispense Refill   Ascorbic Acid (VITAMIN C PO) Take by mouth. Reported on 10/13/2015     atenolol (TENORMIN) 100 MG tablet TAKE 1 TABLET BY MOUTH EVERYDAY AT BEDTIME 90 tablet 1   FYAVOLV 1-5 MG-MCG TABS tablet Take 1 tablet by mouth daily.     Multiple Vitamins-Minerals (MULTI-VITAMIN GUMMIES PO) Take 1 each by mouth daily.       No current facility-administered medications on file prior to visit.    No Known Allergies  Past Medical History:  Diagnosis Date   Adenomatous colon polyp 01/2009   Allergy    Contraceptive management    Frequent UTI    GERD (gastroesophageal reflux disease)    Migraine    Uterine fibroid    Dr. Philis Pique   Past Surgical History:  Procedure Laterality Date   CESAREAN SECTION     COLONOSCOPY  01/10/2009   tubular adenoma (Dr. Collene Mares); repeat every 5 years   Social History   Socioeconomic History   Marital status: Married    Spouse name: Not on file   Number of children: 1   Years of education: Not on file   Highest education level: Not on file  Occupational History    Occupation: Product manager: [employer]  Tobacco Use   Smoking status: Never   Smokeless tobacco: Never  Vaping Use   Vaping Use: Never used  Substance and Sexual Activity   Alcohol use: Not Currently    Comment: 1/week or less   Drug use: No   Sexual activity: Yes    Partners: Male    Birth control/protection: Pill  Other Topics Concern   Not on file  Social History Narrative   Lives with husband (used to be out of town a lot, Administrator) and 1 daughter. Has 3 stepsons, one moved back home, is working. (All in [place])   Got a [place] 2018.  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   Husband diagnosed with stage 4 prostate cancer and kidney disease 12/2017.   06/2019 husband on dialysis (doing well, got part-time job teaching truck driving)      Updated 07/2021   Social Determinants of Health   Financial Resource Strain: Not on file  Food Insecurity: Not on file  Transportation Needs: Not on file  Physical Activity: Not on file  Stress: Not on file  Social Connections: Not on file  Intimate Partner Violence: Not on file   Family History  Problem Relation Age of Onset   Hyperlipidemia Mother    Hypertension Mother    Colon polyps Father    Hyperlipidemia Father    Hypertension Father    Gout Father    Hypertension Brother    Heart disease Maternal Grandfather    Cancer Neg Hx    Diabetes Neg Hx   '  Vitals  BP 130/74   Pulse 85   Temp 97.7 F (36.5 C) (Oral)   Wt 174 lb (78.9 kg)   BMI 35.14 kg/m   Gen: Alert and oriented x 3, no acute distress HEENT: [place]/AT,  no scleral icterus, no pale conjunctivae, hearing normal, oral mucosa moist Neck: Supple Cardio: Regular rate and rhythm Resp: Pulmonary effort normal on room air GI: Soft, nontender, nondistended GU: Musc: Extremities: No pedal edema Neuro: Grossly non focal  Psych: Calm, cooperative  Laboratory     Latest Ref Rng & Units  07/20/2021   10:45 AM 03/10/2020    2:05 PM 06/18/2019   10:44 AM  CBC  WBC 3.4 - 10.8 x10E3/uL 5.3  5.9  7.5   Hemoglobin 11.1 - 15.9 g/dL 14.2  13.4  13.1   Hematocrit 34.0 - 46.6 % 41.8  41.1  39.8   Platelets 150 - 450 x10E3/uL 239  291  305       Latest Ref Rng & Units 08/07/2021   10:23 AM 07/20/2021   10:45 AM 03/10/2020    2:05 PM  CMP  Glucose 70 - 99 mg/dL  82  83   BUN 6 - 24 mg/dL  13  10   Creatinine 0.57 - 1.00 mg/dL  0.84  0.76   Sodium 134 - 144 mmol/L  139  136   Potassium 3.5 - 5.2 mmol/L  4.3  4.6   Chloride 96 - 106 mmol/L  103  101   CO2 20 - 29 mmol/L  23  19   Calcium 8.7 - 10.2 mg/dL  9.4  9.4   Total Protein 6.0 - 8.5 g/dL  8.9    Total Bilirubin 0.0 - 1.2 mg/dL  0.5    Alkaline Phos 44 - 121 IU/L  62    AST 0 - 40 IU/L  29    ALT 6 - 29 U/L 19  23     Imaging US abdomen 08/13/21 FINDINGS: ULTRASOUND ABDOMEN   Gallbladder: 4 mm gallstone. No pericholecystic fluid or wall thickening visualized. No sonographic Murphy sign noted by sonographer.   Common bile duct: Diameter: 2 mm   Liver: Diffusely increased parenchymal echogenicity. Simple appearing 8 mm hepatic cyst in the right lobe. Echogenic focus along the peripheral right lobe of the liver which demonstrates posterior shadowing, most consistent with a calcified granuloma. Portal vein is patent on color Doppler imaging with normal direction of blood flow towards the liver.   IVC: No abnormality visualized.   Pancreas: Visualized portion unremarkable.  Spleen: Size and appearance within normal limits.   Right Kidney: Length: 9.1 cm. Echogenicity within normal limits. No mass or hydronephrosis visualized.   Left Kidney: Length: 10.3 cm. Echogenicity within normal limits. No mass or hydronephrosis visualized.   Abdominal aorta: No aneurysm visualized.   Other findings: None.   ULTRASOUND HEPATIC ELASTOGRAPHY   Device: Siemens Helix VTQ   Patient position: Oblique   Transducer  5C1   Number of measurements: 10   Hepatic segment:  8   Median kPa: 3.2   IQR: 0.5   IQR/Median kPa ratio: 0.2   Data quality:  Good   Diagnostic category:  < or = 5 kPa: high probability of being normal   The use of hepatic elastography is applicable to patients with viral hepatitis and non-alcoholic fatty liver disease. At this time, there is insufficient data for the referenced cut-off values and use in other causes of liver disease, including alcoholic liver disease. Patients, however, may be assessed by elastography and serve as their own reference standard/baseline.   In patients with non-alcoholic liver disease, the values suggesting compensated advanced chronic liver disease (cACLD) may be lower, and patients may need additional testing with elasticity results of 7-9 kPa.   Please note that abnormal hepatic elasticity and shear wave velocities may also be identified in clinical settings other than with hepatic fibrosis, such as: acute hepatitis, elevated right heart and central venous pressures including use of beta blockers, veno-occlusive disease (Budd-Chiari), infiltrative processes such as mastocytosis/amyloidosis/infiltrative tumor/lymphoma, extrahepatic cholestasis, with hyperemia in the post-prandial state, and with liver transplantation. Correlation with patient history, laboratory data, and clinical condition recommended.   Diagnostic Categories:   < or =5 kPa: high probability of being normal   < or =9 kPa: in the absence of other known clinical signs, rules out cACLD   >9 kPa and ?13 kPa: suggestive of cACLD, but needs further testing   >13 kPa: highly suggestive of cACLD   > or =17 kPa: highly suggestive of cACLD with an increased probability of clinically significant portal hypertension   IMPRESSION: ULTRASOUND ABDOMEN:   The hepatic parenchyma is increased in echogenicity, nonspecific most commonly reflecting hepatic steatosis or  hepatocellular disease.   ULTRASOUND HEPATIC ELASTOGRAPHY:   Median kPa:  3.2   Diagnostic category:  < or = 5 kPa: high probability of being normal    Immunization History  Administered Date(s) Administered   Hepatitis A, Adult 10/02/2021   Hepb-cpg 12/02/2021   Influenza,inj,Quad PF,6+ Mos 06/01/2017, 06/07/2018, 06/18/2019, 06/03/2021   Influenza-Unspecified 07/23/2020   PFIZER(Purple Top)SARS-COV-2 Vaccination 10/20/2019, 11/14/2019, 07/06/2020   Pfizer Covid-19 Vaccine Bivalent Booster 64yr & up 06/03/2021   Td 04/09/2002   Tdap 01/21/2011, 02/04/2021   Problem List Items Addressed This Visit       Digestive   Chronic hepatitis C without hepatic coma ([place] - Primary   Relevant Orders   HCV RNA, Quantitative Real Time PCR     Other   Immunization counseling   Need for hepatitis B vaccination   Relevant Orders   Heplisav-B (HepB-CPG) Vaccine (Completed)    Assessment/Plan: # Chronic Hepatitis C, Treatment naive ( genotype 1b , no cirrhosis) - Completed 12 weeks course of Epclusa - HCV RNA today for SVR check - Fu to be made pending labs today  # Immunization counseling  - Hepatitis B vaccine # 2 today - Schedule Hepatitis A vaccine # 2 6 months after 1st dose.   I have personally spent 40 minutes involved in face-to-face  and non-face-to-face activities for this patient on the day of the visit.   Patients questions were addressed and answered.   Electronically signed by: Rosiland Oz, MD Infectious Diseases  Office phone [phone] Fax no. [phone]

## 2022-03-05 LAB — HEPATITIS C RNA QUANTITATIVE
HCV Quantitative Log: <1.18 log IU/mL
HCV RNA, PCR, QN: <15 IU/mL

## 2022-03-08 ENCOUNTER — Telehealth: Payer: Self-pay

## 2022-03-08 NOTE — Telephone Encounter (Signed)
-----   Message from Rosiland Oz, MD sent at 03/08/2022  6:47 AM EDT ----- Regarding: FW:  Please let her know HCV rna is negative and she has been cured.  ----- Message ----- From: Cheyenne Adas Lab Results In Sent: 03/05/2022   4:48 PM EDT To: Rosiland Oz, MD

## 2022-03-08 NOTE — Telephone Encounter (Signed)
Was able to connect with patient to discuss results. Does not have any questions at this time. Will follow up as needed Leatrice Jewels, RMA

## 2022-03-18 ENCOUNTER — Ambulatory Visit: Payer: 59 | Admitting: Family Medicine

## 2022-03-18 ENCOUNTER — Encounter: Payer: Self-pay | Admitting: Family Medicine

## 2022-03-18 VITALS — BP 130/80 | HR 78 | Wt 172.6 lb

## 2022-03-18 DIAGNOSIS — R309 Painful micturition, unspecified: Secondary | ICD-10-CM | POA: Diagnosis not present

## 2022-03-18 LAB — POCT URINALYSIS DIP (PROADVANTAGE DEVICE)
Bilirubin, UA: NEGATIVE
Blood, UA: NEGATIVE
Glucose, UA: NEGATIVE mg/dL
Ketones, POC UA: NEGATIVE mg/dL
Leukocytes, UA: NEGATIVE
Nitrite, UA: NEGATIVE
Protein Ur, POC: NEGATIVE mg/dL
Specific Gravity, Urine: 1.025
Urobilinogen, Ur: 0.2
pH, UA: 6 (ref 5.0–8.0)

## 2022-03-18 NOTE — Patient Instructions (Signed)
Your urine didn't show any infection. It was very concentrated--you need to be drinking more water.  If you have persistent or worsening pain/spasms in your lower stomach, consider that it may be from your uterus/fibroids. You may want to follow up with your GYN.

## 2022-03-18 NOTE — Progress Notes (Signed)
Chief Complaint  Patient presents with   Acute Visit    Pain with urination.   A week ago she noticed suprapubic discomfort after voiding.  Discomfort was mild, like a mild spasm.  Lasted a few seconds.  This has not occurred every time she voids, in fact hasn't occurred today at all.  This felt similar to discomfort with prior UTI's, but wasn't followed by any burning like in the past.  Hasn't had any burning with urination. Denies hematuria, cloudy urine or odor. Denies urgency, frequency. Slight back pain today, on the left, but has noticed some pain on both sides. Denies vaginal discharge, odor or itch. No abnormal bleeding.  PMH, PSH, SH reviewed  Outpatient Encounter Medications as of 03/18/2022  Medication Sig Note   Ascorbic Acid (VITAMIN C PO) Take by mouth. Reported on 10/13/2015 07/20/2021: Thinks '500mg'$    atenolol (TENORMIN) 100 MG tablet TAKE 1 TABLET BY MOUTH EVERYDAY AT BEDTIME    FYAVOLV 1-5 MG-MCG TABS tablet Take 1 tablet by mouth daily.    Multiple Vitamins-Minerals (MULTI-VITAMIN GUMMIES PO) Take 1 each by mouth daily.      No facility-administered encounter medications on file as of 03/18/2022.   No Known Allergies  ROS: No fever, chills, URI symptoms. No n/v/d. No vaginal discharge. No headaches, dizziness, chest pain. See HPI  PHYSICAL EXAM:  BP 130/80   Pulse 78   Wt 172 lb 9.6 oz (78.3 kg)   SpO2 93%   BMI 34.86 kg/m   Well-appearing, pleasant female in no distress Heart: regular rate and rhythm Lungs: clear Abdomen: no suprapubic tenderness Back: no CVA tenderness (area of discomfort was lower, at lumbar muscles, nontender) Neuro: alert and oriented, cranial nerves grossly intact, normal gait Psych: normal mood, affect, hygiene and grooming  SG 1.025, otherwise clear   ASSESSMENT/PLAN:  Pain with urination - short-lived suprapubic discomfort after voiding, sporadic. Reassured no e/o UTI. Needs to drink more water. f/u if sx worsen/change - Plan: POCT  Urinalysis DIP (Proadvantage Device)

## 2022-04-01 ENCOUNTER — Ambulatory Visit: Payer: 59

## 2022-04-01 ENCOUNTER — Other Ambulatory Visit: Payer: Self-pay

## 2022-04-01 ENCOUNTER — Ambulatory Visit (INDEPENDENT_AMBULATORY_CARE_PROVIDER_SITE_OTHER): Payer: 59

## 2022-04-01 DIAGNOSIS — Z23 Encounter for immunization: Secondary | ICD-10-CM | POA: Diagnosis not present

## 2022-04-02 ENCOUNTER — Ambulatory Visit: Payer: 59

## 2022-04-14 ENCOUNTER — Encounter: Payer: Self-pay | Admitting: Internal Medicine

## 2022-05-29 IMAGING — CR DG KNEE COMPLETE 4+V*R*
4 series · 4 of 4 positions shown · non-contrast
Comparison: None.

CLINICAL DATA: pain at R knee with popping and swelling x 2 months.
No known injury, trauma ,fall

EXAM:
RIGHT KNEE - COMPLETE 4+ VIEW

[w knee ap right]
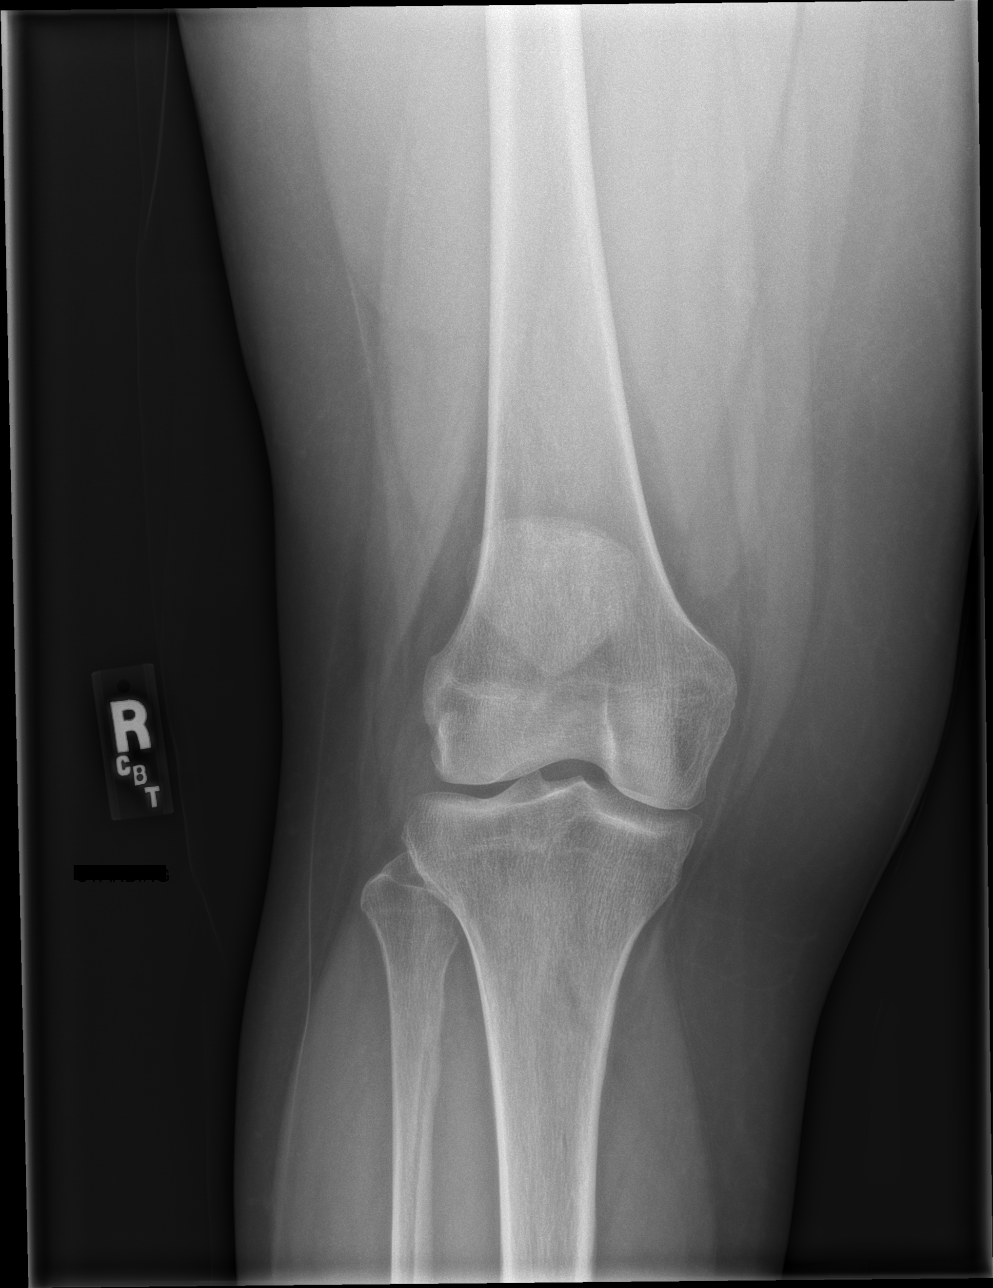

[w knee lat right]
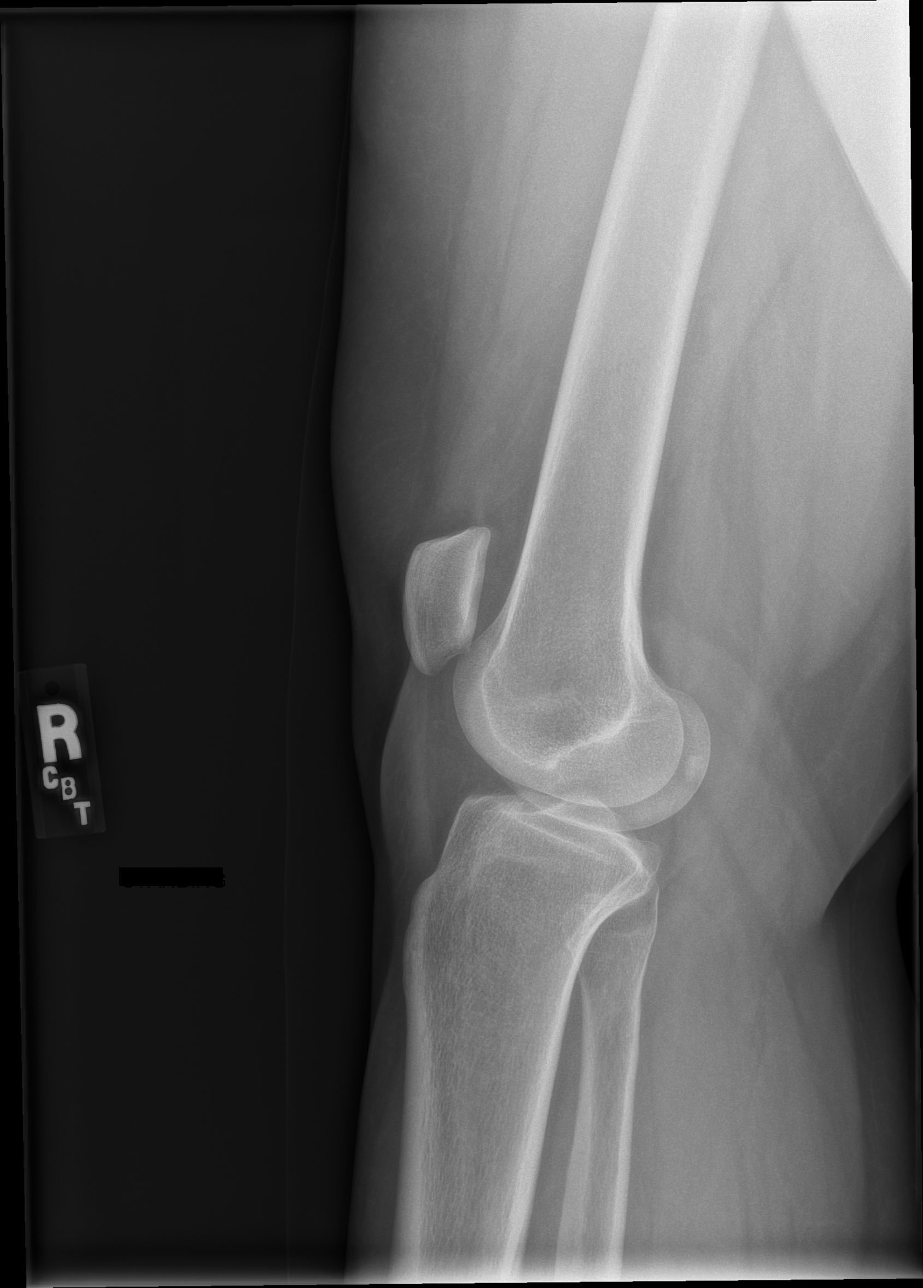

[w knee tunnel pa right]
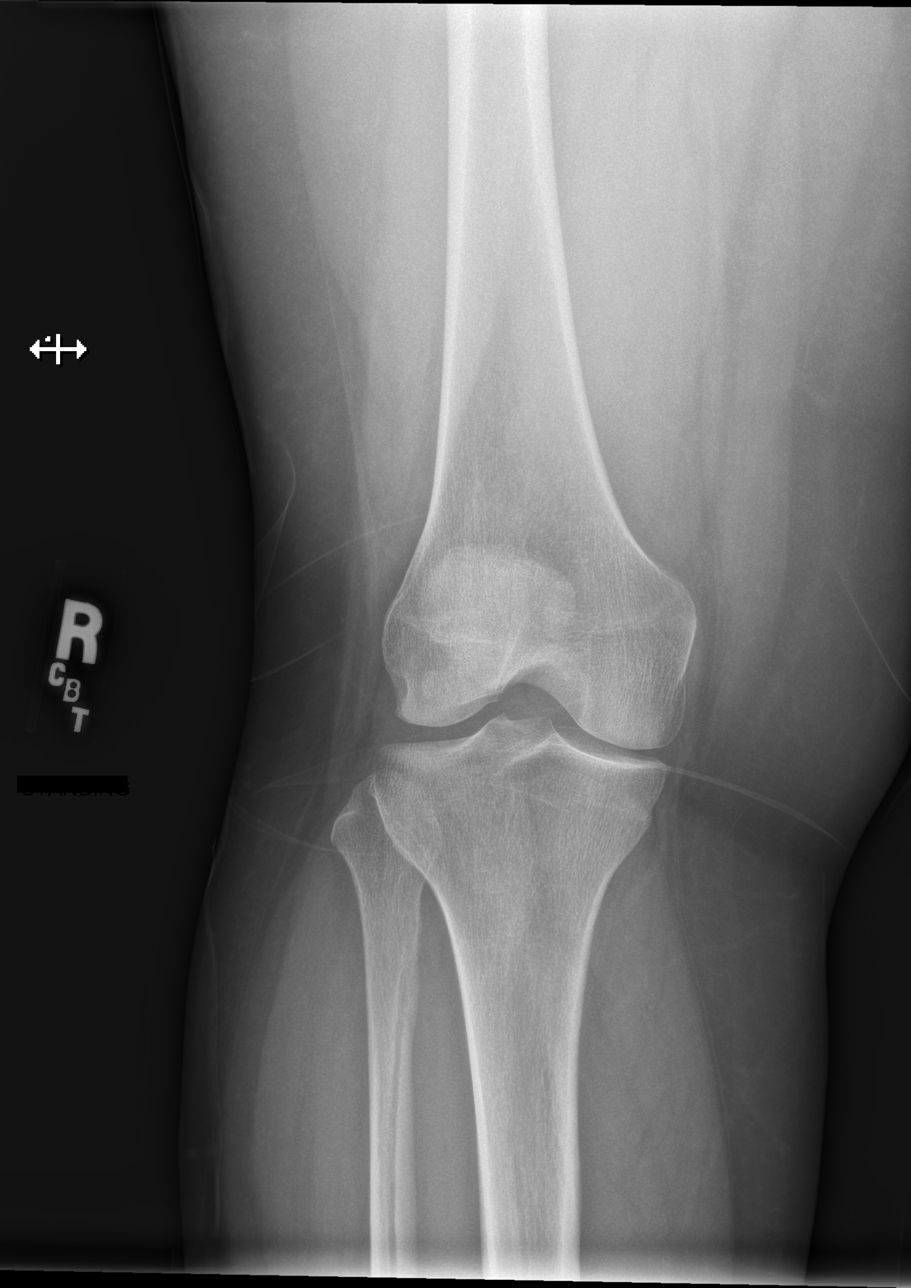

[x knee sunrise right]
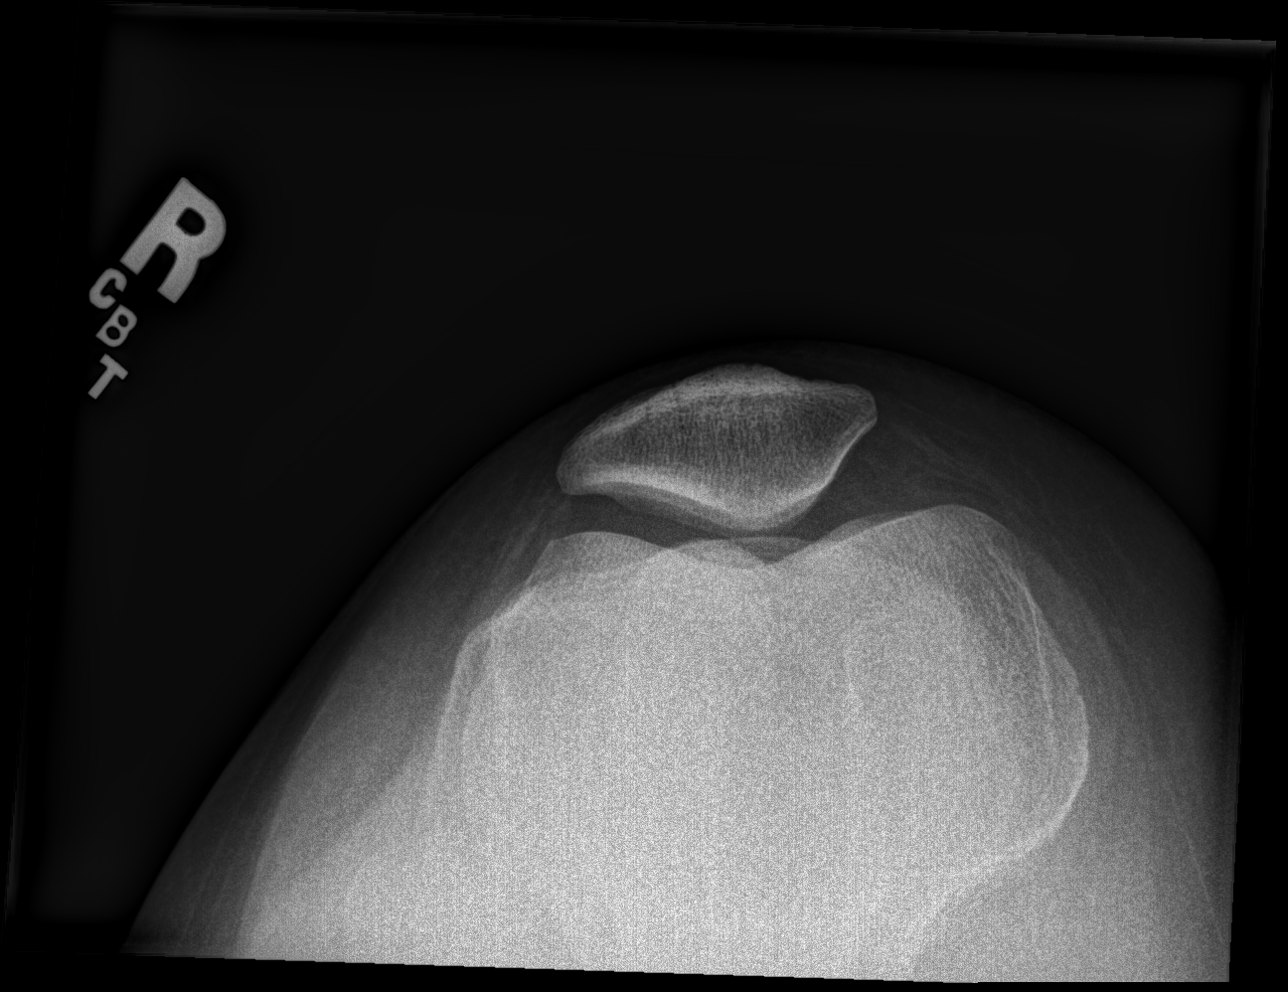

[4 of 4 positions shown; findings below may reference images not displayed]

FINDINGS: No acute fracture or dislocation. Mild degenerative changes of the
medial compartment with joint space narrowing. Mild superior
subluxation of the patella on lateral image is favored to be due to
patient positioning. No area of erosion or osseous destruction. No
unexpected radiopaque foreign body. Soft tissues are unremarkable.
IMPRESSION: Mild degenerative changes of the medial compartment.

## 2022-06-19 IMAGING — US US ABDOMEN COMPLETE W/ ELASTOGRAPHY
2 series · 12 of 25 positions shown · non-contrast
Comparison: None.

CLINICAL DATA: Hepatitis C

EXAM:
ULTRASOUND ABDOMEN
ULTRASOUND HEPATIC ELASTOGRAPHY
TECHNIQUE: Sonography of the upper abdomen was performed. In addition,
ultrasound elastography evaluation of the liver was performed. A
region of interest was placed within the right lobe of the liver.
Following application of a compressive sonographic pulse, tissue
compressibility was assessed. Multiple assessments were performed at
the selected site. Median tissue compressibility was determined.
Previously, hepatic stiffness was assessed by shear wave velocity.
Based on recently published Society of Radiologists in Ultrasound
consensus article, reporting is now recommended to be performed in
the SI units of pressure (kiloPascals) representing hepatic
stiffness/elasticity. The obtained result is compared to the
published reference standards. (cACLD = compensated Advanced Chronic
Liver Disease)

[Series 1: us abdomen complete w/elastography · 9 of 91 slices shown]
[im 6/91]
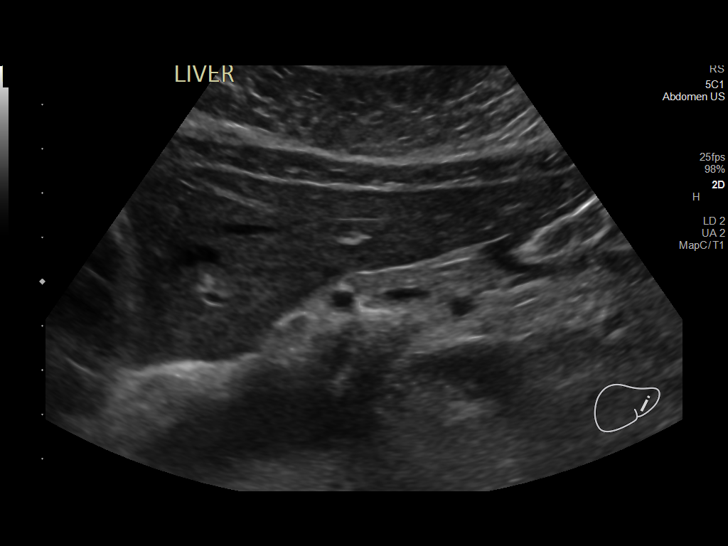
[im 16/91]
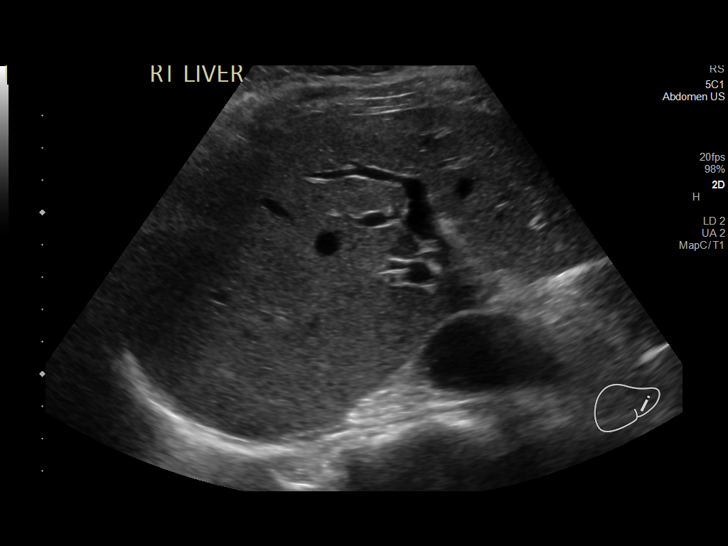
[im 27/91]
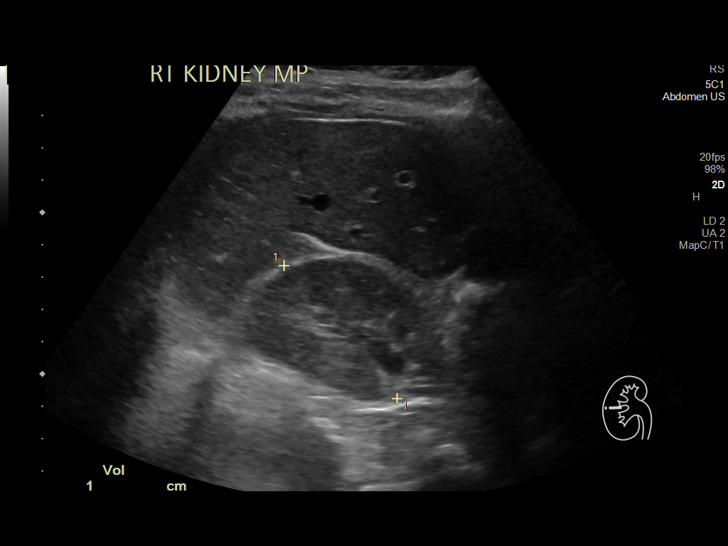
[im 38/91]
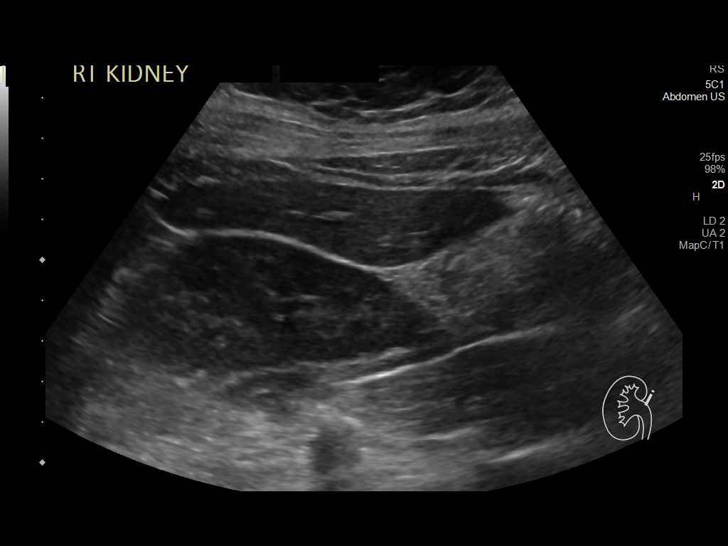
[im 48/91]
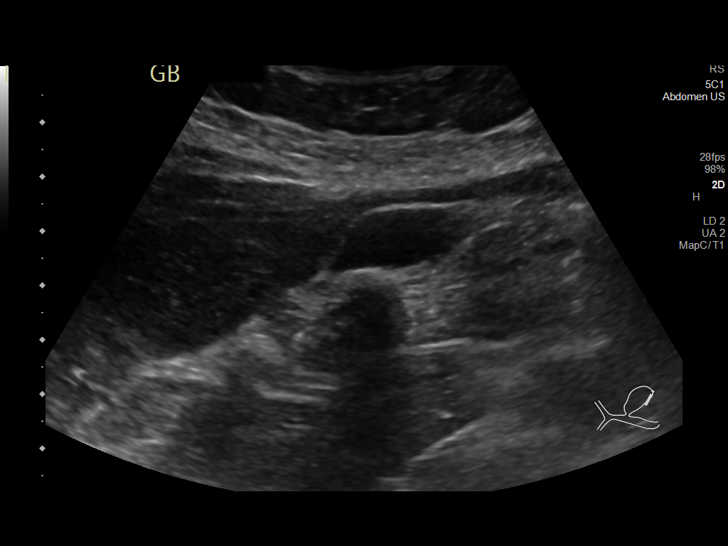
[im 59/91]
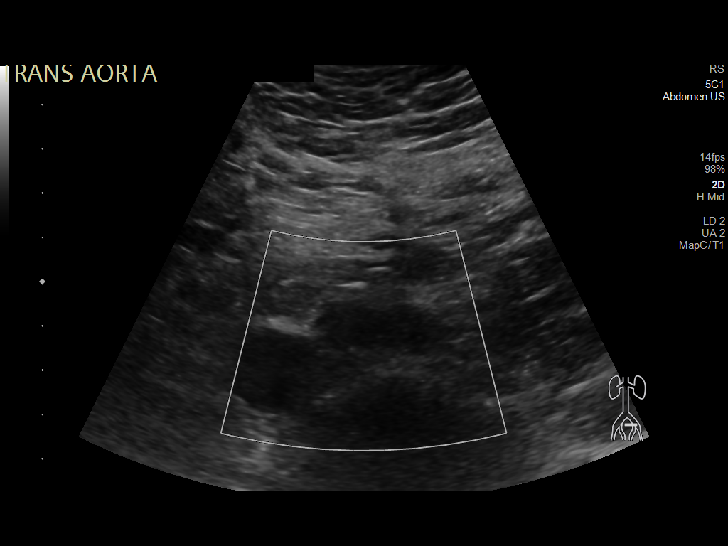
[im 69/91]
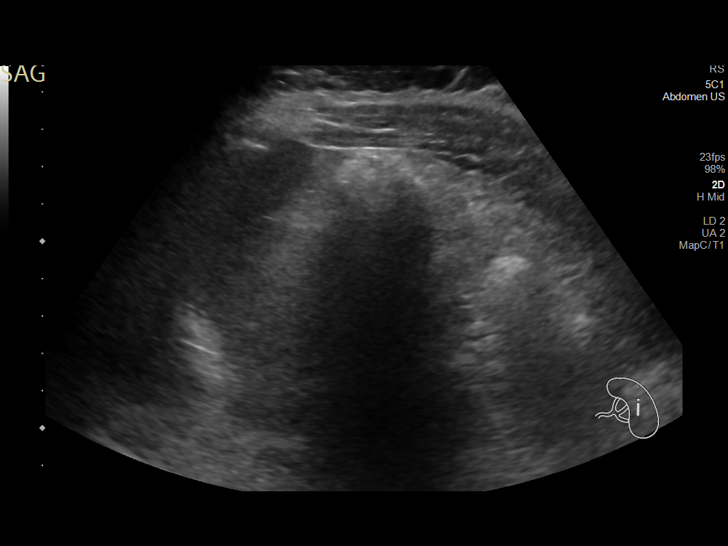
[im 80/91]
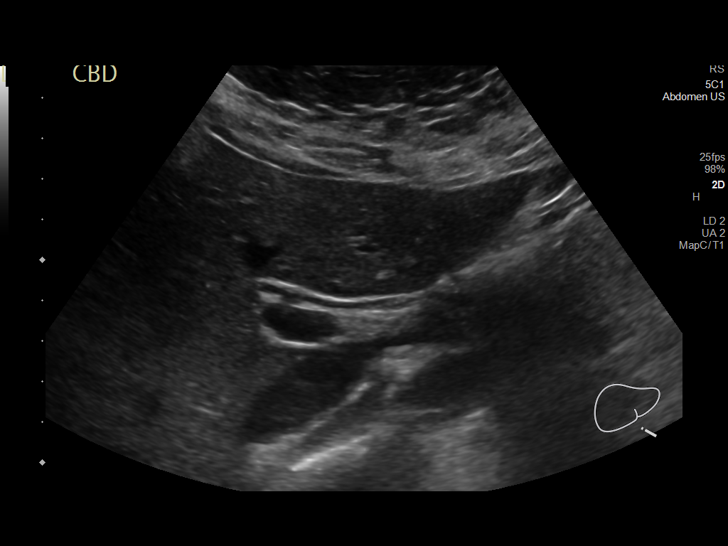
[im 91/91]
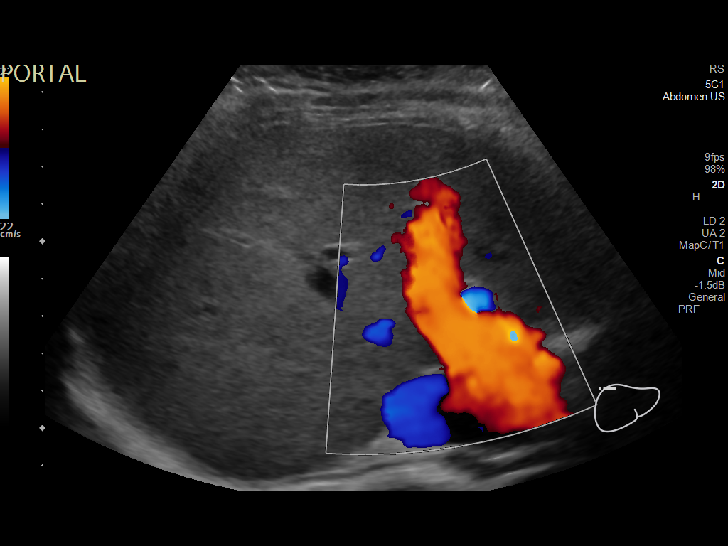

[Series 1001: abdomen us · 3 of 37 slices shown]
[im 7/37]
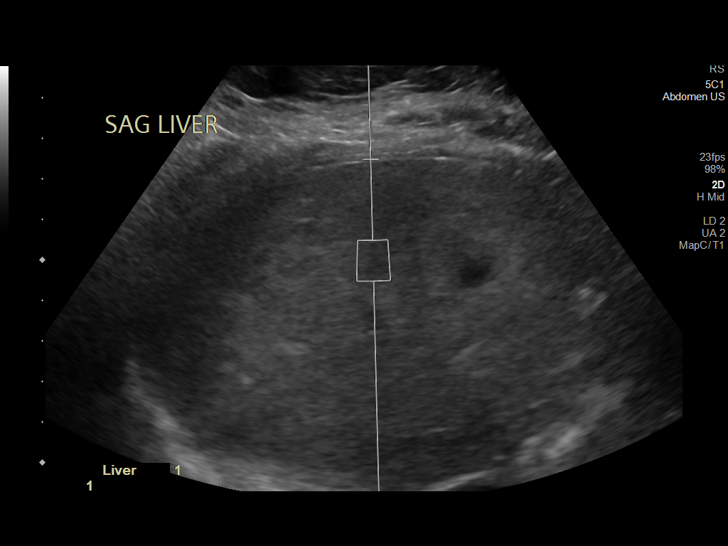
[im 19/37]
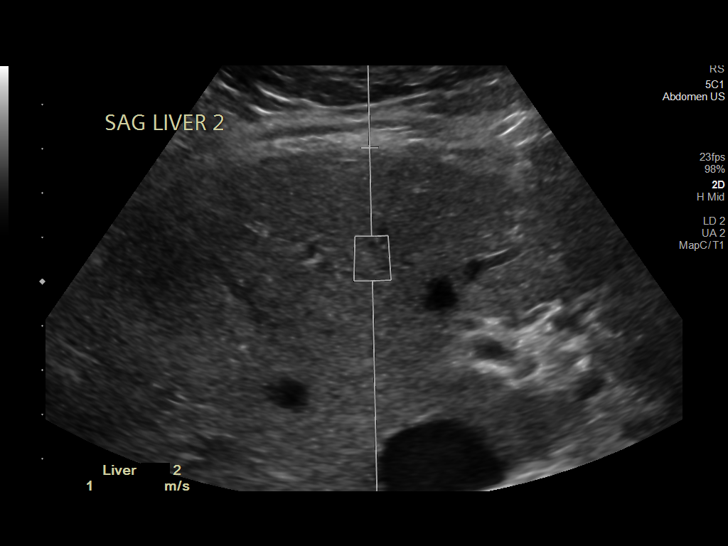
[im 31/37]
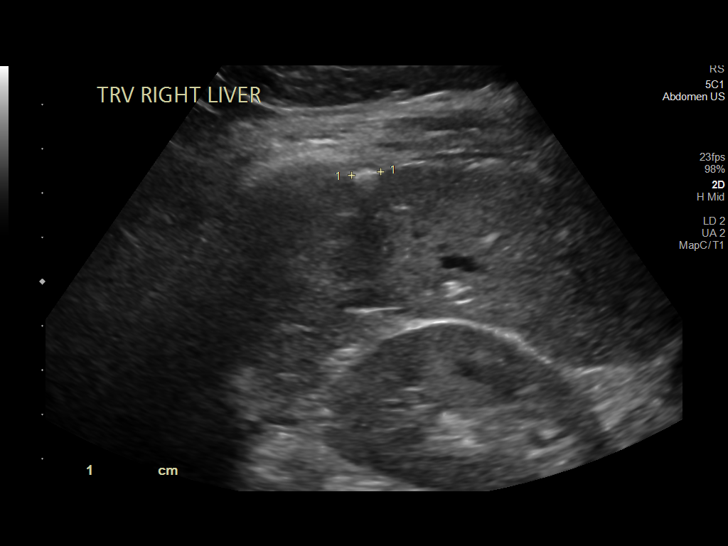

[12 of 25 positions shown; findings below may reference images not displayed]

FINDINGS: ULTRASOUND ABDOMEN

Gallbladder: 4 mm gallstone. No pericholecystic fluid or wall
thickening visualized. No sonographic Murphy sign noted by
sonographer.

Common bile duct: Diameter: 2 mm

Liver: Diffusely increased parenchymal echogenicity. Simple
appearing 8 mm hepatic cyst in the right lobe. Echogenic focus along
the peripheral right lobe of the liver which demonstrates posterior
shadowing, most consistent with a calcified granuloma. Portal vein
is patent on color Doppler imaging with normal direction of blood
flow towards the liver.

IVC: No abnormality visualized.

Pancreas: Visualized portion unremarkable.

Spleen: Size and appearance within normal limits.

Right Kidney: Length: 9.1 cm. Echogenicity within normal limits. No
mass or hydronephrosis visualized.

Left Kidney: Length: 10.3 cm. Echogenicity within normal limits. No
mass or hydronephrosis visualized.

Abdominal aorta: No aneurysm visualized.

Other findings: None.

ULTRASOUND HEPATIC ELASTOGRAPHY

Device: Siemens Helix VTQ

Patient position: Oblique

Transducer [id number]

Number of measurements: 10

Hepatic segment:  8

Median kPa:

IQR:

IQR/Median kPa ratio:

Data quality:  Good

Diagnostic category:  < or = 5 kPa: high probability of being normal

The use of hepatic elastography is applicable to patients with viral
hepatitis and non-alcoholic fatty liver disease. At this time, there
is insufficient data for the referenced cut-off values and use in
other causes of liver disease, including alcoholic liver disease.
Patients, however, may be assessed by elastography and serve as
their own reference standard/baseline.

In patients with non-alcoholic liver disease, the values suggesting
compensated advanced chronic liver disease (cACLD) may be lower, and
patients may need additional testing with elasticity results of [DATE]
kPa.

Please note that abnormal hepatic elasticity and shear wave
velocities may also be identified in clinical settings other than
with hepatic fibrosis, such as: acute hepatitis, elevated right
heart and central venous pressures including use of beta blockers,
Amikko disease ([place]), infiltrative processes such as
mastocytosis/amyloidosis/infiltrative tumor/lymphoma, extrahepatic
cholestasis, with hyperemia in the post-prandial state, and with
liver transplantation. Correlation with patient history, laboratory
data, and clinical condition recommended.

Diagnostic Categories:

< or =5 kPa: high probability of being normal

< or =9 kPa: in the absence of other known clinical signs, rules [DATE] kPa and ?13 kPa: suggestive of cACLD, but needs further testing

>13 kPa: highly suggestive of cACLD

> or =17 kPa: highly suggestive of cACLD with an increased
probability of clinically significant portal hypertension
IMPRESSION: ULTRASOUND ABDOMEN:

The hepatic parenchyma is increased in echogenicity, nonspecific
most commonly reflecting hepatic steatosis or hepatocellular
disease.

ULTRASOUND HEPATIC ELASTOGRAPHY:

Median kPa:

Diagnostic category:  < or = 5 kPa: high probability of being normal

## 2022-07-28 NOTE — Progress Notes (Signed)
No chief complaint on file.   Amy Hill is a 56 y.o. female who presents for a complete physical.   She has the following concerns:  She was diagnosed with hepatitis C upon routine screening at her physical last year.  She is followed in the ID clinic, and treated with Epclusa x 12 weeks.  Viral load was negative in February, April and July. She was advised of "cure", and to f/u as needed. She has completed Hep B (heplisav) and Hep A vaccines. She had US done in 08/2021, incidental finding of gallstone, remains asymptomatic.  She had normal elastography.  Hypertension:  She is compliant with taking 110m of atenolol daily.  She is tolerating this without side effects.  She has been exercising at least every other day (treadmill, weights), and is trying to follow low sodium diet. Denies headaches, dizziness, chest pain, shortness of breath, edema (just sock marks). BP's have been running   BP Readings from Last 3 Encounters:  03/18/22 130/80  03/03/22 130/74  02/04/22 120/70   She has known fibroid uterus. She is under the care of Dr. HPhilis Pique  Last seen in 08/2021, doing well on [place]  Allergies--symptoms are sporadic, sometimes in spring, sometimes in Fall. Not having many symptoms currently. Nasacort is effective when symptomatic, not needing currently.    Immunization History  Administered Date(s) Administered   Hepatitis A, Adult 10/02/2021, 04/01/2022   Hepb-cpg 12/02/2021, 03/03/2022   Influenza,inj,Quad PF,6+ Mos 06/01/2017, 06/07/2018, 06/18/2019, 06/03/2021   Influenza-Unspecified 07/23/2020   PFIZER(Purple Top)SARS-COV-2 Vaccination 10/20/2019, 11/14/2019, 07/06/2020   Pfizer Covid-19 Vaccine Bivalent Booster 15yr& up 06/03/2021   Td 04/09/2002   Tdap 01/21/2011, 02/04/2021   Last Pap smear: 08/2020 with Dr. HoPhilis Piqueast mammogram: 08/2021 (Dr. HoKandice Moosot received). Last colonoscopy: 01/2009, Dr. MaCollene Maresdue again 01/2014 but didn't have.  She called  again in Sept/Oct 2020 to schedule, and found out she owes money. 07/2021 she reported having paid off half, hoped to be able to do within the next few months.  UPDATE Last DEXA: never Ophtho: yearly Dentist: she has gone, needs work done and needs to go back Exercise: Walking on the treadmill 3x/week x 30-45 minutes.  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 Doing some situps (2x/week). No weight-bearing exercise.  Vitamin D level: 42 in 12/2015 (had been as low as 23 in the past) Lipids: Lab Results  Component Value Date   CHOL 204 (H) 07/20/2021   HDL 57 07/20/2021   LDLCALC 137 (H) 07/20/2021   TRIG 53 07/20/2021   CHOLHDL 3.6 07/20/2021    PMH, PSH, SH and FH were reviewed and updated     ROS:  The patient denies anorexia, fever, headaches, vision changes, decreased hearing, ear pain, sore throat, breast concerns, palpitations, dizziness, syncope, cough, swelling, nausea, vomiting, heartburn, diarrhea, constipation, abdominal pain, melena, hematochezia, hematuria, incontinence, dysuria, genital lesions, joint pains, numbness, tingling, weakness, tremor, suspicious skin lesions, depression, abnormal bleeding/bruising, or enlarged lymph nodes. Allergies are controlled with prn medications, not bothering her currently. On HRT--rare hot flashes, mild. No vaginal spotting or abnormal bleeding. NO vaginal discharge.   PHYSICAL EXAM:  There were no vitals taken for this visit.  Wt Readings from Last 3 Encounters:  03/18/22 172 lb 9.6 oz (78.3 kg)  03/03/22 174 lb (78.9 kg)  02/04/22 175 lb (79.4 kg)   General Appearance:   Alert, cooperative, no distress, appears stated age.   Head:    Normocephalic, without obvious abnormality,  atraumatic     Eyes:    PERRL, conjunctiva/corneas clear, EOM's intact, fundi benign     Ears:    Normal TM's and external ear canals     Nose:    Not examined, wearing mask due to COVID-19 pandemic  Throat:    Not examined, wearing  mask due to COVID-19 pandemic  Neck:    Supple, no lymphadenopathy; thyroid: no enlargement/tenderness/nodules; no carotid bruit or JVD. Spine nontender.   Back:    Spine nontender, no curvature, ROM normal, no CVA tenderness     Lungs:    Clear to auscultation bilaterally without wheezes, rales or ronchi; respirations unlabored     Chest Wall:    No tenderness or deformity     Heart:    Regular rate and rhythm, S1 and S2 normal, no murmur, rub or gallop     Breast Exam:    Deferred to GYN     Abdomen:    Soft, non-tender, nondistended, normoactive bowel sounds,   no masses, no hepatosplenomegaly     Genitalia:    Deferred to GYN           Extremities:    No clubbing, cyanosis or edema.   Pulses:    2+ and symmetric all extremities     Skin:    Skin color, texture, turgor normal, no rashes or lesions. Hyperpigmentation at right lateral lower leg (prior trauma) Birthmark at left anterior lower leg (lower shin, above ankle). No changes to skin.  Lymph nodes:    Cervical, supraclavicular  nodes normal     Neurologic:    Normal strength, sensation and gait; reflexes 2+ and symmetric throughout                             Psych:  Normal mood, affect, hygiene and grooming   ***UPDATE if not wearing mask Update skin  ASSESSMENT/PLAN:  PLEASE GET NOTES AND MAMMO FROM DR [facility] FROM 08/2021 HAS SHE SEEN DR Collene Mares? Past due for colonoscopy (was due 01/2014)  Flu, COVID Shingrix (not all 3 today)  C-met, CBC, lipid, D   Discussed monthly self breast exams and yearly mammograms; at least 30 minutes of aerobic activity at least 5 days/week, weight-bearing exercise 2-3x/wk; proper sunscreen use reviewed; healthy diet, including goals of calcium and vitamin D intake and alcohol recommendations (less than or equal to 1 drink/day) reviewed; regular seatbelt use; changing batteries in smoke detectors.  Immunization recommendations discussed--continue yearly flu shots.  COVID booster Shingrix  recommended, risks/side effects reviewed. Plans to return for [place] when convenient. Colonoscopy was due in June 2015--she has paid off half of her balance, hopes to pay off the rest in the next few months and get her colonoscopy.   F/u 6 months, sooner prn.

## 2022-07-28 NOTE — Patient Instructions (Incomplete)
  HEALTH MAINTENANCE RECOMMENDATIONS:  It is recommended that you get at least 30 minutes of aerobic exercise at least 5 days/week (for weight loss, you may need as much as 60-90 minutes). This can be any activity that gets your heart rate up. This can be divided in 10-15 minute intervals if needed, but try and build up your endurance at least once a week.  Weight bearing exercise is also recommended twice weekly.  Eat a healthy diet with lots of vegetables, fruits and fiber.  "Colorful" foods have a lot of vitamins (ie green vegetables, tomatoes, red peppers, etc).  Limit sweet tea, regular sodas and alcoholic beverages, all of which has a lot of calories and sugar.  Up to 1 alcoholic drink daily may be beneficial for women (unless trying to lose weight, watch sugars).  Drink a lot of water.  Calcium recommendations are 1200-1500 mg daily (1500 mg for postmenopausal women or women without ovaries), and vitamin D 1000 IU daily.  This should be obtained from diet and/or supplements (vitamins), and calcium should not be taken all at once, but in divided doses.  Monthly self breast exams and yearly mammograms for women over the age of 33 is recommended.  Sunscreen of at least SPF 30 should be used on all sun-exposed parts of the skin when outside between the hours of 10 am and 4 pm (not just when at beach or pool, but even with exercise, golf, tennis, and yard work!)  Use a sunscreen that says "broad spectrum" so it covers both UVA and UVB rays, and make sure to reapply every 1-2 hours.  Remember to change the batteries in your smoke detectors when changing your clock times in the spring and fall. Carbon monoxide detectors are recommended for your home.  Use your seat belt every time you are in a car, and please drive safely and not be distracted with cell phones and texting while driving.  I recommend getting the new shingles vaccine (Shingrix). This is a series of 2 injections, spaced 2 months apart.   It doesn't have to be exactly 2 months apart (but can't be sooner), if that isn't feasible for your schedule, but try and get them close to 2 months (and definitely within 6 months of each other, or else the efficacy of the vaccine drops off). This should be separated from other vaccines by at least 2 weeks.  Try and cut back on fast foods and red meat. Try and increase your fiber intake and protein intake--snacks that have these, rather than just carbs. Eat more fruit, unsalted nuts, Greek yogurt. Limit the cookies.   Try and walk FLAT (not on an incline) when your knee is bothering you. Picking up the pace, rather than being on an incline, might burn more calories and bother your knee less.

## 2022-07-29 ENCOUNTER — Ambulatory Visit (INDEPENDENT_AMBULATORY_CARE_PROVIDER_SITE_OTHER): Payer: 59 | Admitting: Family Medicine

## 2022-07-29 ENCOUNTER — Encounter: Payer: Self-pay | Admitting: Family Medicine

## 2022-07-29 VITALS — BP 130/80 | HR 80 | Ht 59.0 in | Wt 173.6 lb

## 2022-07-29 DIAGNOSIS — Z8619 Personal history of other infectious and parasitic diseases: Secondary | ICD-10-CM

## 2022-07-29 DIAGNOSIS — Z23 Encounter for immunization: Secondary | ICD-10-CM | POA: Diagnosis not present

## 2022-07-29 DIAGNOSIS — Z0000 Encounter for general adult medical examination without abnormal findings: Secondary | ICD-10-CM

## 2022-07-29 DIAGNOSIS — Z86010 Personal history of colonic polyps: Secondary | ICD-10-CM

## 2022-07-29 DIAGNOSIS — M1711 Unilateral primary osteoarthritis, right knee: Secondary | ICD-10-CM

## 2022-07-29 DIAGNOSIS — E559 Vitamin D deficiency, unspecified: Secondary | ICD-10-CM | POA: Diagnosis not present

## 2022-07-29 DIAGNOSIS — Z6835 Body mass index (BMI) 35.0-35.9, adult: Secondary | ICD-10-CM

## 2022-07-29 DIAGNOSIS — I10 Essential (primary) hypertension: Secondary | ICD-10-CM | POA: Diagnosis not present

## 2022-07-29 LAB — POCT URINALYSIS DIP (PROADVANTAGE DEVICE)
Bilirubin, UA: NEGATIVE
Blood, UA: NEGATIVE
Glucose, UA: NEGATIVE mg/dL
Ketones, POC UA: NEGATIVE mg/dL
Leukocytes, UA: NEGATIVE
Nitrite, UA: NEGATIVE
Protein Ur, POC: NEGATIVE mg/dL
Specific Gravity, Urine: 1.015
Urobilinogen, Ur: 0.2
pH, UA: 6 (ref 5.0–8.0)

## 2022-07-29 MED ORDER — ATENOLOL 100 MG PO TABS: ORAL_TABLET | ORAL | 5 refills | Status: DC

## 2022-07-30 LAB — COMPREHENSIVE METABOLIC PANEL
ALT: 12 IU/L (ref 0–32)
AST: 18 IU/L (ref 0–40)
Albumin/Globulin Ratio: 0.9 — ABNORMAL LOW (ref 1.2–2.2)
Albumin: 4.1 g/dL (ref 3.8–4.9)
Alkaline Phosphatase: 71 IU/L (ref 44–121)
BUN/Creatinine Ratio: 12 (ref 9–23)
BUN: 10 mg/dL (ref 6–24)
Bilirubin Total: 0.4 mg/dL (ref 0.0–1.2)
CO2: 23 mmol/L (ref 20–29)
Calcium: 9.7 mg/dL (ref 8.7–10.2)
Chloride: 103 mmol/L (ref 96–106)
Creatinine, Ser: 0.83 mg/dL (ref 0.57–1.00)
Globulin, Total: 4.5 g/dL (ref 1.5–4.5)
Glucose: 79 mg/dL (ref 70–99)
Potassium: 4.2 mmol/L (ref 3.5–5.2)
Sodium: 139 mmol/L (ref 134–144)
Total Protein: 8.6 g/dL — ABNORMAL HIGH (ref 6.0–8.5)
eGFR: 84 mL/min/{1.73_m2} (ref >59)

## 2022-07-30 LAB — CBC WITH DIFFERENTIAL/PLATELET
Basophils Absolute: 0.1 10*3/uL (ref 0.0–0.2)
Basos: 1 %
EOS (ABSOLUTE): 0.1 10*3/uL (ref 0.0–0.4)
Eos: 1 %
Hematocrit: 43 % (ref 34.0–46.6)
Hemoglobin: 13.9 g/dL (ref 11.1–15.9)
Immature Grans (Abs): 0 10*3/uL (ref 0.0–0.1)
Immature Granulocytes: 0 %
Lymphocytes Absolute: 1.9 10*3/uL (ref 0.7–3.1)
Lymphs: 31 %
MCH: 29.6 pg (ref 26.6–33.0)
MCHC: 32.3 g/dL (ref 31.5–35.7)
MCV: 92 fL (ref 79–97)
Monocytes Absolute: 0.5 10*3/uL (ref 0.1–0.9)
Monocytes: 8 %
Neutrophils Absolute: 3.7 10*3/uL (ref 1.4–7.0)
Neutrophils: 59 %
Platelets: 221 10*3/uL (ref 150–450)
RBC: 4.69 x10E6/uL (ref 3.77–5.28)
RDW: 12.9 % (ref 11.7–15.4)
WBC: 6.2 10*3/uL (ref 3.4–10.8)

## 2022-07-30 LAB — VITAMIN D 25 HYDROXY (VIT D DEFICIENCY, FRACTURES): Vit D, 25-Hydroxy: 21.6 ng/mL — ABNORMAL LOW (ref 30.0–100.0)

## 2022-07-30 LAB — LIPID PANEL
Chol/HDL Ratio: 3.9 ratio (ref 0.0–4.4)
Cholesterol, Total: 248 mg/dL — ABNORMAL HIGH (ref 100–199)
HDL: 63 mg/dL (ref >39)
LDL Chol Calc (NIH): 177 mg/dL — ABNORMAL HIGH (ref 0–99)
Triglycerides: 52 mg/dL (ref 0–149)
VLDL Cholesterol Cal: 8 mg/dL (ref 5–40)

## 2022-08-10 ENCOUNTER — Other Ambulatory Visit: Payer: Self-pay | Admitting: *Deleted

## 2022-08-10 MED ORDER — ERGOCALCIFEROL 1.25 MG (50000 UT) PO CAPS: 50000.0000 [IU] | ORAL_CAPSULE | ORAL | 0 refills | Status: DC

## 2022-08-12 ENCOUNTER — Other Ambulatory Visit (INDEPENDENT_AMBULATORY_CARE_PROVIDER_SITE_OTHER): Payer: 59

## 2022-08-12 DIAGNOSIS — Z23 Encounter for immunization: Secondary | ICD-10-CM | POA: Diagnosis not present

## 2022-08-27 ENCOUNTER — Other Ambulatory Visit (INDEPENDENT_AMBULATORY_CARE_PROVIDER_SITE_OTHER): Payer: 59

## 2022-08-27 DIAGNOSIS — Z23 Encounter for immunization: Secondary | ICD-10-CM

## 2022-09-20 ENCOUNTER — Ambulatory Visit: Payer: 59 | Admitting: Family Medicine

## 2022-09-20 ENCOUNTER — Telehealth: Payer: Self-pay | Admitting: Family Medicine

## 2022-09-20 ENCOUNTER — Ambulatory Visit (INDEPENDENT_AMBULATORY_CARE_PROVIDER_SITE_OTHER): Payer: 59 | Admitting: Family Medicine

## 2022-09-20 ENCOUNTER — Encounter: Payer: Self-pay | Admitting: Family Medicine

## 2022-09-20 ENCOUNTER — Telehealth: Payer: Self-pay | Admitting: *Deleted

## 2022-09-20 VITALS — BP 140/70 | HR 88 | Ht 59.0 in | Wt 167.6 lb

## 2022-09-20 DIAGNOSIS — R079 Chest pain, unspecified: Secondary | ICD-10-CM | POA: Diagnosis not present

## 2022-09-20 DIAGNOSIS — K219 Gastro-esophageal reflux disease without esophagitis: Secondary | ICD-10-CM | POA: Diagnosis not present

## 2022-09-20 DIAGNOSIS — I10 Essential (primary) hypertension: Secondary | ICD-10-CM | POA: Diagnosis not present

## 2022-09-20 NOTE — Progress Notes (Signed)
Chief Complaint  Patient presents with   Chest Pain    Chest pain x 4 days on and off. Dull pain, today felt a sharp pain. Some SOB. Wears a mask at work and does make it somewhat worse. No nausea or fatigue. Feels some twinges in her arms from time to time. Also has been having heartburn, burping more than usual.    3-4 days ago she first noticed shortness of breath when she was washing dishes.  This was after eating (cannot recall what she ate). She noticed more burping at work, has noticed some heartburn.  It comes and goes--doesn't have any heartburn-type sensation or pain currently.  She has been eating more grilled chicken sandwiches with tomatoes. She had lasagna a week ago, didn't have symptoms related to that, hasn't eaten leftovers this week. No spicy foods, pizza. She had orange juice yesterday, and has been drinking some [employer] tropical punches which are citrusy. This isn't particularly new, often drinks this. No change in caffeine intake. She had orange juice this morning before leaving for work.  She had heartburn this morning while at work.  She denies any exertional chest pain or shortness of breath.  She is walking 30-45 minutes 3x/week on her treadmill without any problems.  She works 8:30-5:30, has pain coming sporadically, usually a dull ache in the chest, sometimes central, but can be right or left sided. A couple of times today she noted short-lived sharp pain, prior to eating.  Never had sharp pains before today. It happens randomly throughout the day.  She has some discomfort in the L upper arm, "feel like I've torn something", and doing some stretches of the upper arm has helped. She infrequently has twingers of pain in the right upper arm, medially (proximal to the medial epicondyle).   HTN--BP's have been130-135/77-84 over the past week. Denies palpitations, tachycardia, edema.   PMH, PSH, SH reviewed  Outpatient Encounter Medications as of 09/20/2022   Medication Sig Note   atenolol (TENORMIN) 100 MG tablet TAKE 1 TABLET BY MOUTH EVERYDAY AT BEDTIME    ergocalciferol (VITAMIN D2) 1.25 MG (50000 UT) capsule Take 1 capsule (50,000 Units total) by mouth once a week.    FYAVOLV 1-5 MG-MCG TABS tablet Take 1 tablet by mouth daily.    Multiple Vitamins-Minerals (MULTI-VITAMIN GUMMIES PO) Take 1 each by mouth daily.      Ascorbic Acid (VITAMIN C PO) Take by mouth. Reported on 10/13/2015 (Patient not taking: Reported on 07/29/2022) 09/20/2022: As needed   No facility-administered encounter medications on file as of 09/20/2022.   No Known Allergies  ROS:  no f/c, URI symptoms, n/v/d. No diaphoresis Heartburn and chest pain per HPI. No bleeding, bruising, rash. stressors--daughter waiting for college acceptances  PHYSICAL EXAM:  BP (!) 150/70   Pulse 88   Ht 4' 11"$  (1.499 m)   Wt 167 lb 9.6 oz (76 kg)   BMI 33.85 kg/m   Wt Readings from Last 3 Encounters:  09/20/22 167 lb 9.6 oz (76 kg)  07/29/22 173 lb 9.6 oz (78.7 kg)  03/18/22 172 lb 9.6 oz (78.3 kg)   Pleasant, well-appearing female in no distress. HEENT: conjunctiva and sclera are clear, EOMI, OP clear Neck: no lymphadenopathy, thyromegaly or bruit Heart: regular rate and rhythm, no murmur or gallop Lungs: clear bilaterally, no wheezes, rales, ronchi Chest wall: Only tender at 1 costochondral junction, mid-sternum, on the left. No pain with deep breaths, stretching, arm movements (just slight discomfort LUE with a certain  stretch) Abdomen soft, nontender, no organomegaly Extremities: no edema  EKG:  NSR, rate 78.  LVH, poss LAE. Unchanged when compared to prior EKG's. No acute abnl (pt not currently having pain).    ASSESSMENT/PLAN:  Chest pain, unspecified type - Ddx reviewed; component of reflux, reassuring EKG, MSK component.  Nexium, warm compresses, ibuprofen/Aleve prn. f/u if persists/worsens, may need CXR - Plan: EKG 12-Lead  Essential hypertension - overall  controlled, cont current meds  Gastroesophageal reflux disease without esophagitis - likely contributing to her chest-related symptoms (heartburn, belching, poss SOB). To use OTC Nexium once daily for 2 weeks, reviewed proper diet  Avoid drinking orange juice and other citrusy drinks. Limit your tomatoes, spicy foods, and caffeine. Consider taking nexium once daily for a week or two to treat your reflux, to eliminate that symptom, and see if there continues to be any other issues with chest pain or shortness of breath. There does seem to be a musculoskeletal component, where you were tender at the costochondral junction (where the rib meets up with the breastbone), on the left.  You can use warm compresses if that becomes more painful  You can also use ibuprofen or aleve as needed (with your nexium).  Continue self-care for treating your stressors--time for yourself, dep breaths, exercise. I encourage you to do some yoga with your daughter.

## 2022-09-20 NOTE — Telephone Encounter (Signed)
Patient wanted to know if you thought calling in an albuterol inhaler to have on hand for her SOB would be a good idea? She uses [employer].

## 2022-09-20 NOTE — Telephone Encounter (Signed)
Doesn't look like she has had albuterol since 2018.  I don't think she carries a diagnosis of asthma, nor does this really sound like it.  Having reflux flare CAN cause wheezing/shortness of breath--she is going to take nexium OTC for a couple of weeks. That might help in and of itself.  If she has ongoing issues with chest pain and/or shortness of breath, we should do a chest x-ray.  Please advise

## 2022-09-20 NOTE — Telephone Encounter (Signed)
Patient advised.

## 2022-09-20 NOTE — Patient Instructions (Signed)
Avoid drinking orange juice and other citrusy drinks. Limit your tomatoes, spicy foods, and caffeine. Consider taking nexium once daily for a week or two to treat your reflux, to eliminate that symptom, and see if there continues to be any other issues with chest pain or shortness of breath. There does seem to be a musculoskeletal component, where you were tender at the costochondral junction (where the rib meets up with the breastbone), on the left.  You can use warm compresses if that becomes more painful  You can also use ibuprofen or aleve as needed (with your nexium).  Continue self-care for treating your stressors--time for yourself, dep breaths, exercise. I encourage you to do some yoga with your daughter.

## 2022-09-20 NOTE — Telephone Encounter (Signed)
Pt called and stated that she had been having chest pain and shortness of breath off and on for about 4 days. Pt states that it was becoming concerning but not enough to go to the ER. Pt was offered an appt for today which she declined. PT was asked if she was having any neck or arm pain and pt states she was having arm pain she described as twinges when ask which arm she replied both. Pt was ask on a scale of 1 to 10 where was her pain and she states a 2 or 3.  Pt was again offered an appt for this afternoon and again she declined. Pt was offered an appt for early in the am which she made. Pt was advised that if symptoms changed, or got worse do not hesitate to go to ER. Pt verbalized understanding.

## 2022-09-21 ENCOUNTER — Ambulatory Visit: Payer: 59 | Admitting: Medical

## 2022-10-23 ENCOUNTER — Other Ambulatory Visit: Payer: Self-pay | Admitting: Family Medicine

## 2022-11-08 ENCOUNTER — Other Ambulatory Visit: Payer: 59

## 2022-11-12 ENCOUNTER — Other Ambulatory Visit (INDEPENDENT_AMBULATORY_CARE_PROVIDER_SITE_OTHER): Payer: 59

## 2022-11-12 DIAGNOSIS — Z23 Encounter for immunization: Secondary | ICD-10-CM | POA: Diagnosis not present

## 2022-12-29 ENCOUNTER — Ambulatory Visit (INDEPENDENT_AMBULATORY_CARE_PROVIDER_SITE_OTHER): Payer: 59 | Admitting: Family Medicine

## 2022-12-29 ENCOUNTER — Encounter: Payer: Self-pay | Admitting: Family Medicine

## 2022-12-29 VITALS — BP 130/70 | HR 80 | Temp 98.1°F | Ht 59.0 in | Wt 168.0 lb

## 2022-12-29 DIAGNOSIS — W540XXA Bitten by dog, initial encounter: Secondary | ICD-10-CM

## 2022-12-29 DIAGNOSIS — S91052A Open bite, left ankle, initial encounter: Secondary | ICD-10-CM

## 2022-12-29 MED ORDER — AMOXICILLIN-POT CLAVULANATE 875-125 MG PO TABS
1.0000 | ORAL_TABLET | Freq: Two times a day (BID) | ORAL | 0 refills | Status: DC
Start: 2022-12-29 — End: 2023-01-31

## 2022-12-29 NOTE — Patient Instructions (Signed)
Take the antibiotic to prevent infection. Contact us if you develop fever, increasing swelling, redness or pain. Contact your vet and make sure that rabies vaccines gets updated (if late; likely past due for his other shots). Your tetanus shot is up to date.

## 2022-12-29 NOTE — Progress Notes (Signed)
Chief Complaint  Patient presents with   Animal Bite    Family dog bote her left ankle Sunday. She is fine just wants to make sure it is okay. Last Tdap 02/04/21.   12/26/22, she was walking backwards, may have stepped on her dog's paw, he growled, bit her, saw blood coming from her ankle. She saw one wound. Cleaned it with peroxide, applied neosporin.   It is improving, less sore.  She could barely walk on it on Monday.  Now able to walk more normally, but moving slowly. Swelling is improving  Her dog is a Hotel manager. She reports he hasn't seen vet in 2 years.  Unsure when last rabies vaccine was. Dog is acting normally.  Last TdaP 01/2021  Outpatient Encounter Medications as of 12/29/2022  Medication Sig Note   cholecalciferol (VITAMIN D3) 25 MCG (1000 UNIT) tablet Take 1,000 Units by mouth daily.    FYAVOLV 1-5 MG-MCG TABS tablet Take 1 tablet by mouth daily.    Multiple Vitamins-Minerals (MULTI-VITAMIN GUMMIES PO) Take 1 each by mouth daily.      Ascorbic Acid (VITAMIN C PO) Take by mouth. Reported on 10/13/2015 (Patient not taking: Reported on 07/29/2022) 12/29/2022: As needed   atenolol (TENORMIN) 100 MG tablet TAKE 1 TABLET BY MOUTH EVERYDAY AT BEDTIME    [DISCONTINUED] ergocalciferol (VITAMIN D2) 1.25 MG (50000 UT) capsule Take 1 capsule (50,000 Units total) by mouth once a week.    No facility-administered encounter medications on file as of 12/29/2022.   No Known Allergies  ROS: No f/c, n/v/d, headaches, dizziness, chest pain. No bleeding, bruising, rash.  PHYSICAL EXAM:  BP 130/70   Pulse 80   Temp 98.1 F (36.7 C) (Tympanic)   Ht 4\' 11"  (1.499 m)   Wt 168 lb (76.2 kg)   BMI 33.93 kg/m   Well-appearing female in no distress HEENT: conjunctiva and sclera are clear, EOMI. Heart: regular rate and rhythm Lungs: clear bilaterally LLE:  2-3 mm puncture wound just anterior to the lateral malleolus.  There is some soft tissue swelling.  No signficiant surrounding  edema. No other puncture wounds or skin breaks. 2+ pulses. Normal sensation  ASSESSMENT/PLAN:  Dog bite of left ankle, initial encounter - DOI 5/19.  Will treat prophylactically with Augmentin. Risks/SE reviewed - Plan: amoxicillin-clavulanate (AUGMENTIN) 875-125 MG tablet  Take the antibiotic to prevent infection. Contact us if you develop fever, increasing swelling, redness or pain. Contact your vet and make sure that rabies vaccines gets updated (if late; likely past due for his other shots). Your tetanus shot is up to date.

## 2023-01-30 ENCOUNTER — Encounter: Payer: Self-pay | Admitting: Family Medicine

## 2023-01-30 DIAGNOSIS — E7800 Pure hypercholesterolemia, unspecified: Secondary | ICD-10-CM | POA: Insufficient documentation

## 2023-01-30 DIAGNOSIS — I10 Essential (primary) hypertension: Secondary | ICD-10-CM | POA: Insufficient documentation

## 2023-01-30 NOTE — Progress Notes (Unsigned)
No chief complaint on file.  Patient presents for routine med check (6 month f/u from her CPE). She has had 2 visits since her physical.  Seen for dog bite to her ankle last month.  Seen in 09/2022 with chest pain. EKG was reassuring. Was felt to have a component of reflux.  Encouraged changes to diet, using Nexium OTC x 2 weeks. She had possible MSK component to pain as well, encouraged warm compresses and NSAIDs.  Hypertension:  She is compliant with taking 100mg  of atenolol daily.  She is tolerating this without side effects.   She has been exercising 4 days/week (treadmill x 30-45 mins, walks 1.5-2 miles, hand weights), and is trying to follow low sodium diet.  Denies headaches, dizziness, chest pain, shortness of breath, palpitations, edema (just sock marks).  BP's have been running  Pulse has been running  BP Readings from Last 3 Encounters:  12/29/22 130/70  09/20/22 (!) 140/70  07/29/22 130/80   Hypercholesterolemia: Cholesterol was noted to be significantly at her last check in December.  Dietary changes made-- UPDATE   Component Ref Range & Units 6 mo ago 1 yr ago 3 yr ago 5 yr ago 9 yr ago 12 yr ago  Cholesterol, Total 100 - 199 mg/dL 130 High  865 High  784     Triglycerides 0 - 149 mg/dL 52 53 69 67 R 64 R 78 R  HDL >39 mg/dL 63 57 51 61 R 84 82  VLDL Cholesterol Cal 5 - 40 mg/dL 8 10 13      LDL Chol Calc (NIH) 0 - 99 mg/dL 696 High  295 High  284 High      Chol/HDL Ratio 0.0 - 4.4 ratio 3.9 3.6 CM 3.5 CM 3.0 R 2.5 R 2.5 R    She has known fibroid uterus. She is under the care of Dr. Henderson Cloud.  Last seen in 09/2022 for WWE, doing well on Fyavolvi.   Denies hot flashes, night sweats, denies vaginal dryness or pain.   Allergies--uses Nasacort as needed, not flaring recently.   Obesity:  At her physical she reported eating some fast food, 2x/week (junior burger or chicken sandwich, tries to get salads with grilled chicken). Red meat 2x/week. Lots of  vegetables--corn, green beans, collards, asparagus. Also mentioned juices at her visit for CP. Snacks at work (craisins, cookies) Fish farm manager.     She was diagnosed with hepatitis C upon routine screening at her physical in 2022.  She was seen in the ID clinic, and treated with Epclusa x 12 weeks.  Viral load was negative in February, April and July of 2023. She was advised of "cure". She has completed Hep B (heplisav) and Hep A vaccines. She had US done in 08/2021, incidental finding of gallstone, remains asymptomatic.  She had normal elastography.      UPDATE   PMH, PSH, SH reviewed    ROS:  no f/c, URI symptoms, n/v/d. No headaches, dizziness, exertional chest pain.  No bleeding, bruising, rash. Moods are good  Heartburn, belching, chest pain??? Weight?    PHYSICAL EXAM:  There were no vitals taken for this visit.  Wt Readings from Last 3 Encounters:  12/29/22 168 lb (76.2 kg)  09/20/22 167 lb 9.6 oz (76 kg)  07/29/22 173 lb 9.6 oz (78.7 kg)   Pleasant, well-appearing female in no distress. HEENT: conjunctiva and sclera are clear, EOMI Neck: no lymphadenopathy, thyromegaly or bruit Heart: regular rate and rhythm, no murmur or gallop Lungs: clear  bilaterally, no wheezes, rales, ronchi Chest wall:  Nontender Abdomen soft, nontender, no organomegaly Extremities: no edema Neuro: alert and oriented, cranial nerves grossly intact, normal gait Psych: normal mood, affect, hygiene and grooming   ASSESSMENT/PLAN:  Lipids  Has she scheduled colonoscopy??? LONG past due.  F/u in 6 months for CPE

## 2023-01-31 ENCOUNTER — Ambulatory Visit (INDEPENDENT_AMBULATORY_CARE_PROVIDER_SITE_OTHER): Payer: 59 | Admitting: Family Medicine

## 2023-01-31 ENCOUNTER — Encounter: Payer: Self-pay | Admitting: Family Medicine

## 2023-01-31 VITALS — BP 130/70 | HR 84 | Ht 59.0 in | Wt 169.8 lb

## 2023-01-31 DIAGNOSIS — E6609 Other obesity due to excess calories: Secondary | ICD-10-CM | POA: Diagnosis not present

## 2023-01-31 DIAGNOSIS — I10 Essential (primary) hypertension: Secondary | ICD-10-CM | POA: Diagnosis not present

## 2023-01-31 DIAGNOSIS — E7800 Pure hypercholesterolemia, unspecified: Secondary | ICD-10-CM

## 2023-01-31 DIAGNOSIS — Z5181 Encounter for therapeutic drug level monitoring: Secondary | ICD-10-CM | POA: Diagnosis not present

## 2023-01-31 DIAGNOSIS — Z6834 Body mass index (BMI) 34.0-34.9, adult: Secondary | ICD-10-CM

## 2023-01-31 MED ORDER — ATENOLOL 100 MG PO TABS: ORAL_TABLET | ORAL | 5 refills | Status: DC

## 2023-01-31 NOTE — Assessment & Plan Note (Signed)
Reviewed low cholesterol diet in detail. She has made some improvements. Due for recheck today

## 2023-01-31 NOTE — Assessment & Plan Note (Signed)
Counseled in detail re: diet (portions, healthy choices), and exercise. Strongly encouraged stopping caloric beverages and drinking more water.

## 2023-01-31 NOTE — Assessment & Plan Note (Signed)
Controlled. Continue atenolol, low sodium diet. Encouraged increased exercise and weight loss

## 2023-01-31 NOTE — Patient Instructions (Signed)
Please try and avoid getting sugary beverages--when out and at home. Avoid soda, sweet tea, lemonade and the Min[employer]id punches. Look at the labels--they have a lot of sugar and calories. I would rather you eat whole fruit and drink more water.  Good job on cutting back on the cholesterol in your diet. We will hope that is enough to get the LDL down.

## 2023-02-01 LAB — LIPID PANEL
Chol/HDL Ratio: 4.1 ratio (ref 0.0–4.4)
Cholesterol, Total: 256 mg/dL — ABNORMAL HIGH (ref 100–199)
HDL: 63 mg/dL (ref >39)
LDL Chol Calc (NIH): 184 mg/dL — ABNORMAL HIGH (ref 0–99)
Triglycerides: 56 mg/dL (ref 0–149)
VLDL Cholesterol Cal: 9 mg/dL (ref 5–40)

## 2023-02-01 MED ORDER — ROSUVASTATIN CALCIUM 10 MG PO TABS
10.0000 mg | ORAL_TABLET | Freq: Every day | ORAL | 2 refills | Status: AC
Start: 2023-02-01 — End: ?

## 2023-02-01 NOTE — Addendum Note (Signed)
Addended by: Joselyn Arrow on: 02/01/2023 09:52 AM   Modules accepted: Orders

## 2023-04-07 ENCOUNTER — Other Ambulatory Visit: Payer: 59

## 2023-04-07 DIAGNOSIS — Z5181 Encounter for therapeutic drug level monitoring: Secondary | ICD-10-CM

## 2023-04-07 DIAGNOSIS — E7800 Pure hypercholesterolemia, unspecified: Secondary | ICD-10-CM

## 2023-04-08 LAB — LIPID PANEL
Chol/HDL Ratio: 2.9 ratio (ref 0.0–4.4)
Cholesterol, Total: 155 mg/dL (ref 100–199)
HDL: 53 mg/dL (ref >39)
LDL Chol Calc (NIH): 93 mg/dL (ref 0–99)
Triglycerides: 43 mg/dL (ref 0–149)
VLDL Cholesterol Cal: 9 mg/dL (ref 5–40)

## 2023-04-08 LAB — HEPATIC FUNCTION PANEL
ALT: 12 IU/L (ref 0–32)
AST: 17 IU/L (ref 0–40)
Albumin: 3.9 g/dL (ref 3.8–4.9)
Alkaline Phosphatase: 60 IU/L (ref 44–121)
Bilirubin Total: 0.4 mg/dL (ref 0.0–1.2)
Bilirubin, Direct: 0.12 mg/dL (ref 0.00–0.40)
Total Protein: 7.6 g/dL (ref 6.0–8.5)

## 2023-04-18 LAB — HM COLONOSCOPY

## 2023-04-21 ENCOUNTER — Encounter: Payer: Self-pay | Admitting: *Deleted

## 2023-05-07 ENCOUNTER — Other Ambulatory Visit: Payer: Self-pay | Admitting: Family Medicine

## 2023-05-07 DIAGNOSIS — E7800 Pure hypercholesterolemia, unspecified: Secondary | ICD-10-CM

## 2023-08-03 NOTE — Patient Instructions (Incomplete)

## 2023-08-03 NOTE — Progress Notes (Signed)
Chief Complaint  Patient presents with   Annual Exam    Fasting cpe, having some acif reflux and having some nausea. Flu shot today. Colonoscopy follow-up in 5 years. Requested results. Only getting 30 days at a time for Atenolol wants to keep it that way. Obgyn- horvath   Amy Hill is a 57 y.o. female who presents for a complete physical and follow-up on chronic problems.   She is complaining of reflux and nausea x 2 months. This occurs a few days a week, sometimes in the mornings, sometimes in the evenings.  One trigger is a particular dressing at [employer] (some type of vinaigrette), hasn't come up with other triggers.  Has nausea, and sometimes heartburn. Symptoms last 30-60 minutes, and resolves. Hasn't used any medications to treat these symptoms. Denies dysphagia. She used Nexium OTC for a 2 week course about a month ago. She had symptoms persist for about 2 days, and resolved by day 3 for the rest of the course.  She hasn't had any recurrence of symptoms since she completed the course.  Hypertension:  She is compliant with taking 100mg  of atenolol daily.  She is tolerating this without side effects.  She is trying to follow low sodium diet. Denies headaches, dizziness, chest pain, shortness of breath, palpitations, edema. Hasn't checked BP since August, all normal that she recalls. She recalls pulse in higher 60's to low 70's.   BP Readings from Last 3 Encounters:  08/04/23 120/70  01/31/23 130/70  12/29/22 130/70    Hypercholesterolemia: Cholesterol was noted to be significantly at her physical last year (LDL went from 137 to 177).  In June she reported eating more chicken, fish, more vegetables and fruits. She reported eating red meat once a week, max 2x/week (down from 3-4x/week). Eggs up to 2/week, usually having just egg whites. She reported trying to cut back on cheese and mayonnaise.  She was having 6 inch tuna sub from [employer] (no cheese) once a week, down from  2x/week, and was using more vinaigrette dressings. Surprisingly, LDL increased to 184 in June 2024.  She was started on Rosuvastatin and follow-up lipids were at goal.  Today she reports tolerating rosuvastatin without side effects. She continues to try and follow a low cholesterol diet. Only has cheese once a week; red meat 2x/week or less, eggs 1/week. Doesn't use mayo on sandwiches as often (every other week), has tuna salad once a week.   Component Ref Range & Units (hover) 3 mo ago 6 mo ago 1 yr ago 2 yr ago 4 yr ago 6 yr ago 9 yr ago  Cholesterol, Total 155 256 High  248 High  204 High  176    Triglycerides 43 56 52 53 69 67 R 64 R  HDL 53 63 63 57 51 61 R 84  VLDL Cholesterol Cal 9 9 8 10 13     LDL Chol Calc (NIH) 93 184 High  177 High  137 High  112 High     Chol/HDL Ratio 2.9 4.1 CM 3.9 CM 3.6 CM 3.5 CM 3.0 R 2.5 R   Vitamin D deficiency:  level was low at 21.6 in 07/2022, when taking MVI daily, no additional D3 supplement. She was treated with 12 week prescription course, and advised to start 1000 IU of D3 upon completion, in addition to MVI. She is currently taking D3 1000 + MVI daily.  She has known fibroid uterus. She is under the care of Dr. Henderson Cloud.  Last seen  in 09/2022 for WWE, doing well on Fyavolvi.  Denies hot flashes, night sweats, denies vaginal dryness or pain. Denies abnormal vaginal bleeding.   Allergies--uses Nasacort as needed, not flaring recently. Hasn't needed it in over a year.   Obesity:  Admits she hasn't done any exercise for the last 3 months.  Got out of the habit, no injuries or other excuse. Fast food 2x/week, [employer] or [employer], usually grilled or salad if elsewhere. She cut back on Min[employer] Maid juices. Cut back on soda (1/week, regular). At a restaurant she will get water or sweet tea. Discussed some stressors--daughter being at college, empty nest, and the challenges/stress for her and her daughter.   Hepatitis C--she completed treatment with  Epclusa, and negative viral loads at f/u in ID clinic and was advised of cure. She had normal elastography and [place] (08/2021), and an incidental finding of gallstone, which remains asymptomatic.    Immunization History  Administered Date(s) Administered   Hepatitis A, Adult 10/02/2021, 04/01/2022   Hepb-cpg 12/02/2021, 03/03/2022   Influenza,inj,Quad PF,6+ Mos 06/01/2017, 06/07/2018, 06/18/2019, 06/03/2021, 07/29/2022   Influenza-Unspecified 07/23/2020   PFIZER(Purple Top)SARS-COV-2 Vaccination 10/20/2019, 11/14/2019, 07/06/2020   Pfizer Covid-19 Vaccine Bivalent Booster 51yrs & up 06/03/2021   Pfizer(Comirnaty)Fall Seasonal Vaccine 12 years and older 08/12/2022   Td 04/09/2002   Tdap 01/21/2011, 02/04/2021   Zoster Recombinant(Shingrix) 08/27/2022, 11/12/2022   Last Pap smear: 08/2020 with Dr. Henderson Cloud Last mammogram: 09/2022 Last colonoscopy: 04/2023, tubular adenoma, f/u in 5 years. Last DEXA: never Ophtho: yearly Dentist: needs more dental work done. Cleanings are overdue. Exercise: none x 3 months. Prior routine had been: 4 days/week--treadmill x 30-45 mins, walks 1.5-2 miles, hand weights Does occasional situps.    PMH, PSH, SH and FH were reviewed and updated  Outpatient Encounter Medications as of 08/04/2023  Medication Sig Note   Ascorbic Acid (VITAMIN C PO) Take by mouth. Reported on 10/13/2015 01/31/2023: As needed   atenolol (TENORMIN) 100 MG tablet TAKE 1 TABLET BY MOUTH EVERYDAY AT BEDTIME    cholecalciferol (VITAMIN D3) 25 MCG (1000 UNIT) tablet Take 1,000 Units by mouth daily.    FYAVOLV 1-5 MG-MCG TABS tablet Take 1 tablet by mouth daily.    Multiple Vitamins-Minerals (MULTI-VITAMIN GUMMIES PO) Take 1 each by mouth daily.      rosuvastatin (CRESTOR) 10 MG tablet Take 1 tablet by mouth once daily    No facility-administered encounter medications on file as of 08/04/2023.   Uses Nexium OTC prn  No Known Allergies  ROS:  The patient denies anorexia, fever, headaches,  vision changes, decreased hearing, ear pain, sore throat, breast concerns, palpitations, dizziness, syncope, cough, swelling, vomiting, heartburn, diarrhea, constipation, abdominal pain, melena, hematochezia, hematuria, incontinence, dysuria, genital lesions, joint pains, numbness, tingling, weakness, tremor, suspicious skin lesions, depression, abnormal bleeding/bruising, or enlarged lymph nodes. Allergies are controlled with prn medications, not bothering her currently. On HRT--no hot flashes or night sweats. No abnormal bleeding Intermittent R knee pain, swelling.  No locking or giving out. Reflux and nausea per HPI, none currently.   PHYSICAL EXAM:  BP 120/70   Pulse 60   Ht 5' 0.25" (1.53 m)   Wt 174 lb (78.9 kg)   BMI 33.70 kg/m   Wt Readings from Last 3 Encounters:  08/04/23 174 lb (78.9 kg)  01/31/23 169 lb 12.8 oz (77 kg)  12/29/22 168 lb (76.2 kg)   07/29/22 173 lb 9.6 oz (78.7 kg)    General Appearance:   Alert, cooperative, no distress, appears  stated age.   Head:    Normocephalic, without obvious abnormality, atraumatic     Eyes:    PERRL, conjunctiva/corneas clear, EOM's intact, fundi benign     Ears:    Normal TM's and external ear canals     Nose:    No drainage or sinus tenderness  Throat:    Normal mucosa  Neck:    Supple, no lymphadenopathy; thyroid: no enlargement/tenderness/nodules; no carotid bruit or JVD. Spine nontender.   Back:    Spine nontender, no curvature, ROM normal, no CVA tenderness     Lungs:    Clear to auscultation bilaterally without wheezes, rales or ronchi; respirations unlabored     Chest Wall:    No tenderness or deformity     Heart:    Regular rate and rhythm, S1 and S2 normal, no murmur, rub or gallop     Breast Exam:    Deferred to GYN     Abdomen:    Soft, non-tender, nondistended, normoactive bowel sounds,   no masses, no hepatosplenomegaly     Genitalia:    Deferred to GYN           Extremities:    No clubbing, cyanosis or edema.    Pulses:    2+ and symmetric all extremities     Skin:    Skin color, texture, turgor normal, no rashes or lesions.  Lymph nodes:    Cervical, supraclavicular nodes normal     Neurologic:    Normal strength, sensation and gait; reflexes 2+ and symmetric throughout                             Psych:  Normal mood, affect, hygiene and grooming    ASSESSMENT/PLAN:  Annual physical exam -     Comprehensive metabolic panel -     CBC with Differential/Platelet -     VITAMIN D 25 Hydroxy (Vit-D Deficiency, Fractures)  Essential hypertension Assessment & Plan: Controlled. Continue atenolol, low sodium diet. Encouraged daily exercise and weight loss  Orders: -     Atenolol; TAKE 1 TABLET BY MOUTH EVERYDAY AT BEDTIME  Dispense: 90 tablet; Refill: 1 -     Comprehensive metabolic panel  Pure hypercholesterolemia Assessment & Plan: Reviewed low cholesterol diet.  Lipids at goal on rosuvastatin, and is tolerating well.  Continue rosuvastatin.  Orders: -     Rosuvastatin Calcium; Take 1 tablet (10 mg total) by mouth daily.  Dispense: 90 tablet; Refill: 1  Vitamin D deficiency Assessment & Plan: Due for recheck. Continue D3 1000 IU and MVI daily  Orders: -     VITAMIN D 25 Hydroxy (Vit-D Deficiency, Fractures)  Medication monitoring encounter -     Comprehensive metabolic panel -     CBC with Differential/Platelet -     VITAMIN D 25 Hydroxy (Vit-D Deficiency, Fractures)  Gastroesophageal reflux disease without esophagitis Assessment & Plan: Reviewed diet/behavioral measures.  She had improvement after a 2 week course of OTC Nexium. Discussed using Pepcid AC (vs nexium) prior to triggering meals, and short-acting antacids prn when symptomatic   Need for influenza vaccination -     Flu vaccine trivalent PF, 6mos and older(Flulaval,Afluria,Fluarix,Fluzone)    Counseled re: empty nesting/worrying.  Discussed monthly self breast exams and yearly mammograms; at least 30 minutes of  aerobic activity at least 5 days/week, weight-bearing exercise 2-3x/wk; proper sunscreen use reviewed; healthy diet, including goals of calcium and vitamin D intake  and alcohol recommendations (less than or equal to 1 drink/day) reviewed; regular seatbelt use; changing batteries in smoke detectors.  Immunization recommendations discussed-- Flu shot given today. COVID booster declined today, prefers to get separately. Colonoscopy UTD, due again 04/2028   F/u 6 months, sooner prn. Fasting med check

## 2023-08-04 ENCOUNTER — Encounter: Payer: Self-pay | Admitting: Family Medicine

## 2023-08-04 ENCOUNTER — Ambulatory Visit (INDEPENDENT_AMBULATORY_CARE_PROVIDER_SITE_OTHER): Payer: 59 | Admitting: Family Medicine

## 2023-08-04 VITALS — BP 120/70 | HR 60 | Ht 60.25 in | Wt 174.0 lb

## 2023-08-04 DIAGNOSIS — E559 Vitamin D deficiency, unspecified: Secondary | ICD-10-CM

## 2023-08-04 DIAGNOSIS — I10 Essential (primary) hypertension: Secondary | ICD-10-CM | POA: Diagnosis not present

## 2023-08-04 DIAGNOSIS — Z5181 Encounter for therapeutic drug level monitoring: Secondary | ICD-10-CM

## 2023-08-04 DIAGNOSIS — Z23 Encounter for immunization: Secondary | ICD-10-CM | POA: Diagnosis not present

## 2023-08-04 DIAGNOSIS — Z0000 Encounter for general adult medical examination without abnormal findings: Secondary | ICD-10-CM | POA: Diagnosis not present

## 2023-08-04 DIAGNOSIS — K219 Gastro-esophageal reflux disease without esophagitis: Secondary | ICD-10-CM

## 2023-08-04 DIAGNOSIS — E7800 Pure hypercholesterolemia, unspecified: Secondary | ICD-10-CM

## 2023-08-04 MED ORDER — ROSUVASTATIN CALCIUM 10 MG PO TABS
10.0000 mg | ORAL_TABLET | Freq: Every day | ORAL | 1 refills | Status: DC
Start: 2023-08-04 — End: 2024-03-24

## 2023-08-04 MED ORDER — ATENOLOL 100 MG PO TABS
ORAL_TABLET | ORAL | 1 refills | Status: DC
Start: 2023-08-04 — End: 2024-05-23

## 2023-08-05 LAB — CBC WITH DIFFERENTIAL/PLATELET
Basophils Absolute: 0.1 10*3/uL (ref 0.0–0.2)
Basos: 1 %
EOS (ABSOLUTE): 0.1 10*3/uL (ref 0.0–0.4)
Eos: 1 %
Hematocrit: 40.2 % (ref 34.0–46.6)
Hemoglobin: 13.3 g/dL (ref 11.1–15.9)
Immature Grans (Abs): 0 10*3/uL (ref 0.0–0.1)
Immature Granulocytes: 0 %
Lymphocytes Absolute: 2.6 10*3/uL (ref 0.7–3.1)
Lymphs: 39 %
MCH: 30.4 pg (ref 26.6–33.0)
MCHC: 33.1 g/dL (ref 31.5–35.7)
MCV: 92 fL (ref 79–97)
Monocytes Absolute: 0.7 10*3/uL (ref 0.1–0.9)
Monocytes: 11 %
Neutrophils Absolute: 3.2 10*3/uL (ref 1.4–7.0)
Neutrophils: 48 %
Platelets: 207 10*3/uL (ref 150–450)
RBC: 4.38 x10E6/uL (ref 3.77–5.28)
RDW: 12.9 % (ref 11.7–15.4)
WBC: 6.7 10*3/uL (ref 3.4–10.8)

## 2023-08-05 LAB — COMPREHENSIVE METABOLIC PANEL
ALT: 14 [IU]/L (ref 0–32)
AST: 22 [IU]/L (ref 0–40)
Albumin: 4.3 g/dL (ref 3.8–4.9)
Alkaline Phosphatase: 67 [IU]/L (ref 44–121)
BUN/Creatinine Ratio: 14 (ref 9–23)
BUN: 11 mg/dL (ref 6–24)
Bilirubin Total: 0.4 mg/dL (ref 0.0–1.2)
CO2: 23 mmol/L (ref 20–29)
Calcium: 9.5 mg/dL (ref 8.7–10.2)
Chloride: 103 mmol/L (ref 96–106)
Creatinine, Ser: 0.81 mg/dL (ref 0.57–1.00)
Globulin, Total: 4 g/dL (ref 1.5–4.5)
Glucose: 78 mg/dL (ref 70–99)
Potassium: 4.4 mmol/L (ref 3.5–5.2)
Sodium: 143 mmol/L (ref 134–144)
Total Protein: 8.3 g/dL (ref 6.0–8.5)
eGFR: 86 mL/min/{1.73_m2} (ref >59)

## 2023-08-05 LAB — VITAMIN D 25 HYDROXY (VIT D DEFICIENCY, FRACTURES): Vit D, 25-Hydroxy: 35.7 ng/mL (ref 30.0–100.0)

## 2023-08-05 NOTE — Assessment & Plan Note (Signed)
Controlled. Continue atenolol, low sodium diet. Encouraged daily exercise and weight loss

## 2023-08-05 NOTE — Assessment & Plan Note (Signed)
Reviewed diet/behavioral measures.  She had improvement after a 2 week course of OTC Nexium. Discussed using Pepcid AC (vs nexium) prior to triggering meals, and short-acting antacids prn when symptomatic

## 2023-08-05 NOTE — Assessment & Plan Note (Signed)
Due for recheck. Continue D3 1000 IU and MVI daily

## 2023-08-05 NOTE — Assessment & Plan Note (Signed)
Reviewed low cholesterol diet.  Lipids at goal on rosuvastatin, and is tolerating well.  Continue rosuvastatin.

## 2023-09-19 ENCOUNTER — Other Ambulatory Visit (INDEPENDENT_AMBULATORY_CARE_PROVIDER_SITE_OTHER): Payer: PRIVATE HEALTH INSURANCE

## 2023-09-19 DIAGNOSIS — Z23 Encounter for immunization: Secondary | ICD-10-CM

## 2023-10-27 LAB — HM MAMMOGRAPHY

## 2023-12-01 ENCOUNTER — Other Ambulatory Visit: Payer: Self-pay | Admitting: Family Medicine

## 2023-12-01 DIAGNOSIS — R0609 Other forms of dyspnea: Secondary | ICD-10-CM

## 2023-12-01 NOTE — Telephone Encounter (Signed)
 Copied from CRM [id number]. Topic: Clinical - Medication Refill >> Dec 01, 2023  3:42 PM Marissa P wrote: Most Recent Primary Care Visit:  Provider: [facility]  Department: [facility]  Visit Type: LAB  Date: 09/19/2023  Medication: Albuterol  (an inhaler)  Has the patient contacted their pharmacy? No (Agent: If no, request that the patient contact the pharmacy for the refill. If patient does not wish to contact the pharmacy document the reason why and proceed with request.) (Agent: If yes, when and what did the pharmacy advise?)  Is this the correct pharmacy for this prescription? Yes If no, delete pharmacy and type the correct one.  This is the patient's preferred pharmacy:  [facility] [address] ([address] [place] - [address] [place] ([place]) [place] [address] Phone: [phone] Fax: [phone]   Has the prescription been filled recently? No  Is the patient out of the medication? Yes  Has the patient been seen for an appointment in the last year OR does the patient have an upcoming appointment? Yes  Can we respond through MyChart? Yes  Agent: Please be advised that Rx refills may take up to 3 business days. We ask that you follow-up with your pharmacy.

## 2023-12-02 NOTE — Telephone Encounter (Signed)
 Last appt. 08/04/23 and I do not see were you have filled this for the pt. Before.

## 2023-12-02 NOTE — Telephone Encounter (Signed)
 Someone put this in as a refill for a nebulizer, not inhaler. She has previously used albuterol  INHALER (not neb), but not prescribed since 2018. Please see why she needs it.  If sick, can refill the albuterol  MDI (not neb).  She may need an appointment, and can offer one for next week please

## 2023-12-04 MED ORDER — ALBUTEROL SULFATE HFA 108 (90 BASE) MCG/ACT IN AERS: 2.0000 | INHALATION_SPRAY | Freq: Four times a day (QID) | RESPIRATORY_TRACT | 0 refills | Status: AC | PRN

## 2024-02-02 ENCOUNTER — Encounter: Payer: Self-pay | Admitting: *Deleted

## 2024-02-06 ENCOUNTER — Encounter: Payer: PRIVATE HEALTH INSURANCE | Admitting: Family Medicine

## 2024-02-06 DIAGNOSIS — E7800 Pure hypercholesterolemia, unspecified: Secondary | ICD-10-CM

## 2024-02-06 DIAGNOSIS — Z5181 Encounter for therapeutic drug level monitoring: Secondary | ICD-10-CM

## 2024-02-06 DIAGNOSIS — I10 Essential (primary) hypertension: Secondary | ICD-10-CM

## 2024-02-15 NOTE — Progress Notes (Deleted)
 No chief complaint on file.  Patient presents for 6 month follow-up on chronic problems.  At her physical in December, she mentioned having 2 months of intermittent nausea, and some heartburn. OTC Nexium was effective, using a 2 week course. ***recurrence?  Hypertension:  She is compliant with taking 100mg  of atenolol  daily.  She is tolerating this without side effects.  She is trying to follow low sodium diet. Denies headaches, dizziness, chest pain, shortness of breath, palpitations, edema.  BP's are running *** Pulse   BP Readings from Last 3 Encounters:  08/04/23 120/70  01/31/23 130/70  12/29/22 130/70    Hypercholesterolemia: Patient reports compliance with rosuvastatin , and denies side effects. She tries to follow a low cholesterol diet. Only has cheese once a week; red meat 2x/week or less, eggs 1/week. Doesn't use mayo on sandwiches as often (every other week), has tuna salad once a week.   Lab Results  Component Value Date   CHOL 155 04/07/2023   HDL 53 04/07/2023   LDLCALC 93 04/07/2023   TRIG 43 04/07/2023   CHOLHDL 2.9 04/07/2023    Vitamin D  deficiency:  Last level was normal in December, when taking 1000 IU of D3 plus a MVI.  Level was low at 21.6 in 07/2022, when taking MVI only. She is currently taking 1000 IU of D3 and a daily MVI. ***  Component Ref Range & Units (hover) 6 mo ago 1 yr ago 8 yr ago 10 yr ago 13 yr ago  Vit D, 25-Hydroxy 35.7 21.6 Low  CM 42 R, CM 34 R, CM 23 Low  R     Obesity:  She had gained 5# at her physical in December.  At that time she reported not getting any exercise for the last 3 months.  Got out of the habit, no injuries or other excuse. Fast food 2x/week, [employer] or [employer], usually grilled or salad if elsewhere. She cut back on Min[employer]id juices. Cut back on soda (1/week, regular). At a restaurant she will get water or sweet tea. Discussed some stressors--daughter being at college, empty nest, and the challenges/stress  for her and her daughter.  Today she reports ***   Wt Readings from Last 3 Encounters:  08/04/23 174 lb (78.9 kg)  01/31/23 169 lb 12.8 oz (77 kg)  12/29/22 168 lb (76.2 kg)      PMH, PSH, SH reviewed     ROS:  no f/c, URI symptoms, n/v/d, abdominal pain or bowel changes. Denies reflux, belching or chest pain. No headaches, dizziness, exertional chest pain.  No bleeding, bruising, rash. Moods are good  weight    PHYSICAL EXAM:  There were no vitals taken for this visit.  Wt Readings from Last 3 Encounters:  08/04/23 174 lb (78.9 kg)  01/31/23 169 lb 12.8 oz (77 kg)  12/29/22 168 lb (76.2 kg)   Pleasant, well-appearing female in no distress. HEENT: conjunctiva and sclera are clear, EOMI Neck: no lymphadenopathy, thyromegaly or bruit Heart: regular rate and rhythm, no murmur or gallop Lungs: clear bilaterally, no wheezes, rales, ronchi Back: no spinal or CVA tenderness Chest: nontender Abdomen soft, nontender, no organomegaly Extremities: no edema Neuro: alert and oriented, cranial nerves grossly intact, normal gait Psych: normal mood, affect, hygiene and grooming     ASSESSMENT/PLAN:   Lipids, cbc, c-met  Prevnar-20  ***RF crestor  after labs back  F/u as scheduled for CPE in 08/2024

## 2024-02-16 ENCOUNTER — Encounter: Payer: PRIVATE HEALTH INSURANCE | Admitting: Family Medicine

## 2024-02-16 DIAGNOSIS — I10 Essential (primary) hypertension: Secondary | ICD-10-CM

## 2024-03-08 ENCOUNTER — Encounter: Payer: PRIVATE HEALTH INSURANCE | Admitting: Family Medicine

## 2024-03-21 NOTE — Progress Notes (Signed)
 Chief Complaint  Patient presents with   Medication Management   Patient presents for 6 month follow-up on chronic problems.   Hypertension:  She is compliant with taking 100mg  of atenolol  daily.  She is tolerating this without side effects.  She is trying to follow low sodium diet. Denies headaches, dizziness, chest pain, shortness of breath, palpitations, edema. (She had some shortness of breath in the spring, related to allergies, r/b albuterol ).   BP's are running 124-134/74-86, mostly 130/80. Pulse 71-75   BP Readings from Last 3 Encounters:  03/22/24 (!) 150/75  08/04/23 120/70  01/31/23 130/70     Hypercholesterolemia: Patient reports compliance with rosuvastatin , and denies side effects. She tries to follow a low cholesterol diet. Only has cheese once a week; red meat 2x/week or less, eggs 1/week. Doesn't use mayo on sandwiches as often, has tuna salad once a week. Lipids were at goal on this regimen, due for recheck.   Lab Results  Component Value Date   CHOL 155 04/07/2023   HDL 53 04/07/2023   LDLCALC 93 04/07/2023   TRIG 43 04/07/2023   CHOLHDL 2.9 04/07/2023     Vitamin D  deficiency:  Last level was normal in December, when taking 1000 IU of D3 plus a MVI.  Level was low at 21.6 in 07/2022, when taking MVI only. She is currently taking 1000 IU of D3 and a daily MVI.    Component Ref Range & Units (hover) 7 mo ago 1 yr ago 8 yr ago 10 yr ago 13 yr ago  Vit D, 25-Hydroxy 35.7 21.6 Low  CM 42 R, CM 34 R, CM 23 Low  R,     Obesity:  She had gained 5# at her physical in December.  At that time she reported not getting any exercise for the last 3 months.  Got out of the habit, no injuries or other excuse. She has gained another 5# since her last visit. Fast food 2x/week, [employer] or [employer]. Often goes home for lunch, has leftovers. Has 1-2 regular sodas/week At a restaurant she will get water or sometimes sweet tea (1-1.5 glasses)   She hasn't been  exercising.  Diet hasn't changed much. She used to get on the treadmill 2-3x/week.  She has been busy with work, getting home late.  Stark stepson is now living at home with them, and is in the room with the treadmill.  Not getting home until 7:30 pm, then eats a late dinner.   Wt Readings from Last 3 Encounters:  03/22/24 179 lb 3.2 oz (81.3 kg)  08/04/23 174 lb (78.9 kg)  01/31/23 169 lb 12.8 oz (77 kg)         PMH, PSH, SH reviewed   Outpatient Encounter Medications as of 03/22/2024  Medication Sig   albuterol  (VENTOLIN  HFA) 108 (90 Base) MCG/ACT inhaler Inhale 2 puffs into the lungs every 6 (six) hours as needed for wheezing or shortness of breath. You can use 15 mins prior to exercise also   Ascorbic Acid (VITAMIN C PO) Take by mouth. Reported on 10/13/2015   atenolol  (TENORMIN ) 100 MG tablet TAKE 1 TABLET BY MOUTH EVERYDAY AT BEDTIME   cholecalciferol (VITAMIN D3) 25 MCG (1000 UNIT) tablet Take 1,000 Units by mouth daily.   FYAVOLV 1-5 MG-MCG TABS tablet Take 1 tablet by mouth daily.   Multiple Vitamins-Minerals (MULTI-VITAMIN GUMMIES PO) Take 1 each by mouth daily.     rosuvastatin  (CRESTOR ) 10 MG tablet Take 1 tablet (10 mg total) by  mouth daily.   No facility-administered encounter medications on file as of 03/22/2024.   No Known Allergies    ROS:  no f/c, URI symptoms, n/v/d, abdominal pain or bowel changes. Denies reflux/heartburn. No headaches, dizziness, exertional chest pain, palpitations. No bleeding, bruising, rash. Moods are good Weight gain.       PHYSICAL EXAM:   BP (!) 150/75 (BP Location: Right Arm, Patient Position: Sitting, Cuff Size: Large)   Pulse 69   Ht 5' 1 (1.549 m)   Wt 179 lb 3.2 oz (81.3 kg)   BMI 33.86 kg/m   144/70 on recheck by MD 120/80 on recheck by nurse at end of visit.  Wt Readings from Last 3 Encounters:  03/22/24 179 lb 3.2 oz (81.3 kg)  08/04/23 174 lb (78.9 kg)  01/31/23 169 lb 12.8 oz (77 kg)   Pleasant, well-appearing  female in no distress. HEENT: conjunctiva and sclera are clear, EOMI Neck: no lymphadenopathy, thyromegaly or bruit Heart: regular rate and rhythm, no murmur or gallop Lungs: clear bilaterally, no wheezes, rales, ronchi Back: no spinal or CVA tenderness Chest: nontender Abdomen soft, nontender, no organomegaly Extremities: no edema Neuro: alert and oriented, cranial nerves grossly intact, normal gait Psych: normal mood, affect, hygiene and grooming     ASSESSMENT/PLAN:   Essential hypertension - high initially, improved on recheck. BP's okay at home. Cont low Na diet. Encouraged daily exercise, wt loss .Continue current meds - Plan: Comprehensive metabolic panel  Pure hypercholesterolemia - due for lipids. Reviewed lowfat, low cholesterol diet.  Refill statin after labs back - Plan: Lipid panel  Vitamin D  deficiency - continue supplements - Plan: VITAMIN D  25 Hydroxy (Vit-D Deficiency, Fractures)  Class 1 obesity due to excess calories with serious comorbidity and body mass index (BMI) of 33.0 to 33.9 in adult - counseled re: healthy diet, portions, exercise. Pack lunch, exercise x 15 mins, snack 3-4pm, smaller dinners  Medication monitoring encounter - Plan: Comprehensive metabolic panel, VITAMIN D  25 Hydroxy (Vit-D Deficiency, Fractures), CBC with Differential, Lipid panel  Lipids, cbc, c-met, requests D to be checked today also     F/u as scheduled for CPE in 08/2024   Have the conversation with your son, so that you can use your treadmill. Try and pack lunch for work, so you have more time at lunch where you can also find some time for exercise (outside or in your office). Pack some snacks for you to have at 3-4 or on the way home so you aren't as hungry. Try and keep your evening meals smaller, since they are so late.

## 2024-03-22 ENCOUNTER — Encounter: Payer: Self-pay | Admitting: Family Medicine

## 2024-03-22 ENCOUNTER — Ambulatory Visit (INDEPENDENT_AMBULATORY_CARE_PROVIDER_SITE_OTHER): Payer: PRIVATE HEALTH INSURANCE | Admitting: Family Medicine

## 2024-03-22 VITALS — BP 120/80 | HR 76 | Ht 61.0 in | Wt 179.2 lb

## 2024-03-22 DIAGNOSIS — E66811 Obesity, class 1: Secondary | ICD-10-CM

## 2024-03-22 DIAGNOSIS — Z6833 Body mass index (BMI) 33.0-33.9, adult: Secondary | ICD-10-CM

## 2024-03-22 DIAGNOSIS — E6609 Other obesity due to excess calories: Secondary | ICD-10-CM

## 2024-03-22 DIAGNOSIS — K219 Gastro-esophageal reflux disease without esophagitis: Secondary | ICD-10-CM

## 2024-03-22 DIAGNOSIS — E7800 Pure hypercholesterolemia, unspecified: Secondary | ICD-10-CM | POA: Diagnosis not present

## 2024-03-22 DIAGNOSIS — I10 Essential (primary) hypertension: Secondary | ICD-10-CM | POA: Diagnosis not present

## 2024-03-22 DIAGNOSIS — E559 Vitamin D deficiency, unspecified: Secondary | ICD-10-CM

## 2024-03-22 DIAGNOSIS — Z5181 Encounter for therapeutic drug level monitoring: Secondary | ICD-10-CM

## 2024-03-22 NOTE — Patient Instructions (Signed)
  Have the conversation with your son, so that you can use your treadmill. Try and pack lunch for work, so you have more time at lunch where you can also find some time for exercise (outside or in your office). Pack some snacks for you to have at 3-4 or on the way home so you aren't as hungry. Try and keep your evening meals smaller, since they are so late.

## 2024-03-23 LAB — COMPREHENSIVE METABOLIC PANEL WITH GFR
ALT: 14 IU/L (ref 0–32)
AST: 23 IU/L (ref 0–40)
Albumin: 4.1 g/dL (ref 3.8–4.9)
Alkaline Phosphatase: 69 IU/L (ref 44–121)
BUN/Creatinine Ratio: 14 (ref 9–23)
BUN: 11 mg/dL (ref 6–24)
Bilirubin Total: 0.4 mg/dL (ref 0.0–1.2)
CO2: 20 mmol/L (ref 20–29)
Calcium: 9.5 mg/dL (ref 8.7–10.2)
Chloride: 101 mmol/L (ref 96–106)
Creatinine, Ser: 0.78 mg/dL (ref 0.57–1.00)
Globulin, Total: 4.2 g/dL (ref 1.5–4.5)
Glucose: 76 mg/dL (ref 70–99)
Potassium: 4 mmol/L (ref 3.5–5.2)
Sodium: 138 mmol/L (ref 134–144)
Total Protein: 8.3 g/dL (ref 6.0–8.5)
eGFR: 89 mL/min/1.73 (ref >59)

## 2024-03-23 LAB — CBC WITH DIFFERENTIAL/PLATELET
Basophils Absolute: 0 x10E3/uL (ref 0.0–0.2)
Basos: 1 %
EOS (ABSOLUTE): 0.1 x10E3/uL (ref 0.0–0.4)
Eos: 1 %
Hematocrit: 43.4 % (ref 34.0–46.6)
Hemoglobin: 13.9 g/dL (ref 11.1–15.9)
Immature Grans (Abs): 0 x10E3/uL (ref 0.0–0.1)
Immature Granulocytes: 0 %
Lymphocytes Absolute: 2 x10E3/uL (ref 0.7–3.1)
Lymphs: 34 %
MCH: 29.5 pg (ref 26.6–33.0)
MCHC: 32 g/dL (ref 31.5–35.7)
MCV: 92 fL (ref 79–97)
Monocytes Absolute: 0.7 x10E3/uL (ref 0.1–0.9)
Monocytes: 12 %
Neutrophils Absolute: 3 x10E3/uL (ref 1.4–7.0)
Neutrophils: 52 %
Platelets: 204 x10E3/uL (ref 150–450)
RBC: 4.71 x10E6/uL (ref 3.77–5.28)
RDW: 13 % (ref 11.7–15.4)
WBC: 5.8 x10E3/uL (ref 3.4–10.8)

## 2024-03-23 LAB — LIPID PANEL
Chol/HDL Ratio: 3 ratio (ref 0.0–4.4)
Cholesterol, Total: 187 mg/dL (ref 100–199)
HDL: 63 mg/dL (ref >39)
LDL Chol Calc (NIH): 115 mg/dL — ABNORMAL HIGH (ref 0–99)
Triglycerides: 47 mg/dL (ref 0–149)
VLDL Cholesterol Cal: 9 mg/dL (ref 5–40)

## 2024-03-24 ENCOUNTER — Ambulatory Visit: Payer: Self-pay | Admitting: Family Medicine

## 2024-03-24 MED ORDER — ROSUVASTATIN CALCIUM 10 MG PO TABS: 10.0000 mg | ORAL_TABLET | Freq: Every day | ORAL | 1 refills | Status: DC

## 2024-03-28 NOTE — Progress Notes (Signed)
 Added by Therisa in lab.

## 2024-04-02 LAB — SPECIMEN STATUS REPORT

## 2024-04-02 LAB — VITAMIN D 25 HYDROXY (VIT D DEFICIENCY, FRACTURES): Vit D, 25-Hydroxy: 26.8 ng/mL — ABNORMAL LOW (ref 30.0–100.0)

## 2024-05-22 ENCOUNTER — Other Ambulatory Visit: Payer: Self-pay | Admitting: Family Medicine

## 2024-05-22 DIAGNOSIS — I10 Essential (primary) hypertension: Secondary | ICD-10-CM

## 2024-08-16 ENCOUNTER — Encounter: Payer: Self-pay | Admitting: *Deleted

## 2024-08-19 ENCOUNTER — Encounter: Payer: Self-pay | Admitting: *Deleted

## 2024-08-19 NOTE — Patient Instructions (Incomplete)

## 2024-08-19 NOTE — Progress Notes (Unsigned)
 " No chief complaint on file.  Amy Hill is a 59 y.o. female who presents for a complete physical and follow-up on chronic problems.  She's sees Dr. Sarrah yearly for her GYN care, last 10/2023.  Hypertension:  She is compliant with taking 100mg  of atenolol  daily.  She is tolerating this without side effects.  She is trying to follow low sodium diet. Denies headaches, dizziness, chest pain, shortness of breath, palpitations, edema.   BP's are running *** Pulse ***   BP Readings from Last 3 Encounters:  03/22/24 120/80  08/04/23 120/70  01/31/23 130/70      Hypercholesterolemia: Patient reports compliance with rosuvastatin , and denies side effects. She tries to follow a low cholesterol diet. Only has cheese once a week; red meat 2x/week or less, eggs 1/week. Doesn't use mayo on sandwiches as often, has tuna salad once a week. Lipids were at goal on last check (though LDL was a little higher than prior year)  Component Ref Range & Units (hover) 5 mo ago (03/22/24) 1 yr ago (04/07/23) 1 yr ago (01/31/23) 2 yr ago (07/29/22) 3 yr ago (07/20/21) 5 yr ago (06/18/19) 7 yr ago (06/01/17)  Cholesterol, Total 187 155 256 High  248 High  204 High  176   Triglycerides 47 43 56 52 53 69 67 R  HDL 63 53 63 63 57 51 61 R  VLDL Cholesterol Cal 9 9 9 8 10 13    LDL Chol Calc (NIH) 115 High  93 184 High  177 High  137 High  112 High    Chol/HDL Ratio 3.0 2.9 CM 4.1 CM 3.9 CM 3.6 CM 3.5 CM 3.0 R     Vitamin D  deficiency:  Last level was low in 03/2024, when taking 1000 IU and MVI. It had been normal on the same regimen in 07/2023.  She was advised to increase her D3 to 2000 IU daily, and continue multivitamin. She is due for recheck. She is currently taking 2000 IU of D3 and a daily MVI.    Component Ref Range & Units (hover) 5 mo ago 1 yr ago 2 yr ago 8 yr ago 10 yr ago 13 yr ago  Vit D, 25-Hydroxy 26.8 Low  35.7 CM 21.6 Low  CM 42 R, CM 34 R, CM 23 Low      Obesity:  We had discussed  at her August visit that she had gained 10# in the last year.  She had gotten out of the habit of getting any exercise. She had reported eating fast food 2x/week, [employer] or [employer]. Often goes home for lunch, has leftovers. Had 1-2 regular sodas/week, and at a restaurant she will get water or sometimes sweet tea (1-1.5 glasses) She had reported prior routine of getting on the treadmill 2-3x/week, but she had reported that her youngest stepson had been living with them, and been in the room with the treadmill.  She also reported being busy with work, getting home late.  Not getting home until 7:30 pm, then eats a late dinner. We had suggested talking to stepson, so she could access the treadmill. Also suggested packing lunches so she could have some time for exercise, and packing a snack for the afternoon, so she wasn't so hungry when she got home late. Encouraged smaller evening meals.  Today she reports ***  Wt Readings from Last 3 Encounters:  03/22/24 179 lb 3.2 oz (81.3 kg)  08/04/23 174 lb (78.9 kg)  01/31/23 169 lb 12.8 oz (  77 kg)   She periodically gets GERD, and uses Nexium OTC 2 week course. ***WHEN??   Hepatitis C--she completed treatment with Epclusa , and negative viral loads at f/u in ID clinic and was advised of cure. She had normal elastography and US  (08/2021), and an incidental finding of gallstone, which remains asymptomatic.    Immunization History  Administered Date(s) Administered   Hepatitis A, Adult 10/02/2021, 04/01/2022   Hepb-cpg 12/02/2021, 03/03/2022   Influenza, Seasonal, Injecte, Preservative Fre 08/04/2023   Influenza,inj,Quad PF,6+ Mos 06/01/2017, 06/07/2018, 06/18/2019, 06/03/2021, 07/29/2022   Influenza-Unspecified 07/23/2020   PFIZER(Purple Top)SARS-COV-2 Vaccination 10/20/2019, 11/14/2019, 07/06/2020   Pfizer Covid-19 Vaccine Bivalent Booster 40yrs & up 06/03/2021   Pfizer(Comirnaty)Fall Seasonal Vaccine 12 years and older 08/12/2022, 09/19/2023   Td  04/09/2002   Tdap 01/21/2011, 02/04/2021   Zoster Recombinant(Shingrix ) 08/27/2022, 11/12/2022   Last Pap smear: 08/2020 with Dr. Sarrah Last mammogram: 10/2023 Last colonoscopy: 04/2023, tubular adenoma, f/u in 5 years. Last DEXA: never Ophtho: yearly Dentist: needs more dental work done. Cleanings are overdue. *** Exercise:   Prior routine had been: 4 days/week--treadmill x 30-45 mins, walks 1.5-2 miles, hand weights Does occasional situps.    PMH, PSH, SH and FH were reviewed and updated    ROS:  The patient denies anorexia, fever, headaches, vision changes, decreased hearing, ear pain, sore throat, breast concerns, palpitations, dizziness, syncope, cough, swelling, vomiting, heartburn, diarrhea, constipation, abdominal pain, melena, hematochezia, hematuria, incontinence, dysuria, genital lesions, joint pains, numbness, tingling, weakness, tremor, suspicious skin lesions, depression, abnormal bleeding/bruising, or enlarged lymph nodes.  Allergies are controlled with prn medications, not bothering her currently. On HRT--no hot flashes or night sweats. No abnormal bleeding Intermittent R knee pain, swelling.  No locking or giving out. Reflux ? ***   PHYSICAL EXAM:  There were no vitals taken for this visit.  Wt Readings from Last 3 Encounters:  03/22/24 179 lb 3.2 oz (81.3 kg)  08/04/23 174 lb (78.9 kg)  01/31/23 169 lb 12.8 oz (77 kg)  01/31/23 169 lb 2.8 oz (77 kg) 07/29/22 173 lb 9.6 oz (78.7 kg)    General Appearance:   Alert, cooperative, no distress, appears stated age.   Head:    Normocephalic, without obvious abnormality, atraumatic     Eyes:    PERRL, conjunctiva/corneas clear, EOM's intact, fundi benign     Ears:    Normal TM's and external ear canals     Nose:    No drainage or sinus tenderness  Throat:    Normal mucosa  Neck:    Supple, no lymphadenopathy; thyroid: no enlargement/tenderness/nodules; no carotid bruit or JVD. Spine nontender.   Back:    Spine  nontender, no curvature, ROM normal, no CVA tenderness     Lungs:    Clear to auscultation bilaterally without wheezes, rales or ronchi; respirations unlabored     Chest Wall:    No tenderness or deformity     Heart:    Regular rate and rhythm, S1 and S2 normal, no murmur, rub or gallop     Breast Exam:    Deferred to GYN     Abdomen:    Soft, non-tender, nondistended, normoactive bowel sounds,   no masses, no hepatosplenomegaly     Genitalia:    Deferred to GYN           Extremities:    No clubbing, cyanosis or edema.   Pulses:    2+ and symmetric all extremities     Skin:  Skin color, texture, turgor normal, no rashes or lesions.  Lymph nodes:    Cervical, supraclavicular nodes normal     Neurologic:    Normal strength, sensation and gait; reflexes 2+ and symmetric throughout                             Psych:  Normal mood, affect, hygiene and grooming    ASSESSMENT/PLAN:  Flu shot, Prevnar-20 COVID booster  Vitamin D , c-met, lipids Had cbc, c-met and lipids in August not sure why... so hard to know what to do now vs wait for 6 month f/u. I guess will do lipids now, bc if BP's are good and if weight any better, maybe we could do yearly visits rather than 6 mo   Add Nexium OTC to med list for prn use? Not if hasn't needed in the last year.  ***RF statin after labs back  Discussed monthly self breast exams and yearly mammograms; at least 30 minutes of aerobic activity at least 5 days/week, weight-bearing exercise 2-3x/wk; proper sunscreen use reviewed; healthy diet, including goals of calcium  and vitamin D  intake and alcohol recommendations (less than or equal to 1 drink/day) reviewed; regular seatbelt use; changing batteries in smoke detectors.  Immunization recommendations discussed-- Flu shot *** Prevnar-20 *** COVID booster -- ***declined today, prefers to get separately. Colonoscopy UTD, due again 04/2028   F/u 6 months, sooner prn. Fasting med check   "

## 2024-08-20 ENCOUNTER — Ambulatory Visit: Payer: PRIVATE HEALTH INSURANCE | Admitting: Family Medicine

## 2024-08-20 ENCOUNTER — Encounter: Payer: Self-pay | Admitting: Family Medicine

## 2024-08-20 VITALS — BP 130/70 | HR 80 | Ht 59.0 in | Wt 178.6 lb

## 2024-08-20 DIAGNOSIS — Z0000 Encounter for general adult medical examination without abnormal findings: Secondary | ICD-10-CM

## 2024-08-20 DIAGNOSIS — I10 Essential (primary) hypertension: Secondary | ICD-10-CM | POA: Diagnosis not present

## 2024-08-20 DIAGNOSIS — Z5181 Encounter for therapeutic drug level monitoring: Secondary | ICD-10-CM

## 2024-08-20 DIAGNOSIS — Z23 Encounter for immunization: Secondary | ICD-10-CM | POA: Diagnosis not present

## 2024-08-20 DIAGNOSIS — E6609 Other obesity due to excess calories: Secondary | ICD-10-CM | POA: Diagnosis not present

## 2024-08-20 DIAGNOSIS — Z6833 Body mass index (BMI) 33.0-33.9, adult: Secondary | ICD-10-CM | POA: Diagnosis not present

## 2024-08-20 DIAGNOSIS — E7800 Pure hypercholesterolemia, unspecified: Secondary | ICD-10-CM | POA: Diagnosis not present

## 2024-08-20 DIAGNOSIS — E559 Vitamin D deficiency, unspecified: Secondary | ICD-10-CM

## 2024-08-20 DIAGNOSIS — E66811 Obesity, class 1: Secondary | ICD-10-CM | POA: Diagnosis not present

## 2024-08-20 MED ORDER — ATENOLOL 100 MG PO TABS: 100.0000 mg | ORAL_TABLET | Freq: Every day | ORAL | 0 refills | Status: AC

## 2024-08-21 ENCOUNTER — Ambulatory Visit: Payer: Self-pay | Admitting: Family Medicine

## 2024-08-21 LAB — VITAMIN D 25 HYDROXY (VIT D DEFICIENCY, FRACTURES): Vit D, 25-Hydroxy: 40.9 ng/mL (ref 30.0–100.0)

## 2024-08-21 LAB — COMPREHENSIVE METABOLIC PANEL WITH GFR
ALT: 17 IU/L (ref 0–32)
AST: 21 IU/L (ref 0–40)
Albumin: 4.1 g/dL (ref 3.8–4.9)
Alkaline Phosphatase: 70 IU/L (ref 49–135)
BUN/Creatinine Ratio: 14 (ref 9–23)
BUN: 10 mg/dL (ref 6–24)
Bilirubin Total: 0.5 mg/dL (ref 0.0–1.2)
CO2: 21 mmol/L (ref 20–29)
Calcium: 9.5 mg/dL (ref 8.7–10.2)
Chloride: 105 mmol/L (ref 96–106)
Creatinine, Ser: 0.74 mg/dL (ref 0.57–1.00)
Globulin, Total: 3.9 g/dL (ref 1.5–4.5)
Glucose: 78 mg/dL (ref 70–99)
Potassium: 4.1 mmol/L (ref 3.5–5.2)
Sodium: 141 mmol/L (ref 134–144)
Total Protein: 8 g/dL (ref 6.0–8.5)
eGFR: 94 mL/min/1.73

## 2024-08-21 LAB — LIPID PANEL
Chol/HDL Ratio: 2.7 ratio (ref 0.0–4.4)
Cholesterol, Total: 155 mg/dL (ref 100–199)
HDL: 57 mg/dL
LDL Chol Calc (NIH): 90 mg/dL (ref 0–99)
Triglycerides: 31 mg/dL (ref 0–149)
VLDL Cholesterol Cal: 8 mg/dL (ref 5–40)

## 2024-08-21 MED ORDER — ROSUVASTATIN CALCIUM 10 MG PO TABS: 10.0000 mg | ORAL_TABLET | Freq: Every day | ORAL | 3 refills | Status: AC

## 2024-09-04 ENCOUNTER — Other Ambulatory Visit

## 2024-09-05 ENCOUNTER — Other Ambulatory Visit

## 2024-09-11 ENCOUNTER — Other Ambulatory Visit (INDEPENDENT_AMBULATORY_CARE_PROVIDER_SITE_OTHER)

## 2024-09-11 DIAGNOSIS — Z23 Encounter for immunization: Secondary | ICD-10-CM

## 2024-09-12 ENCOUNTER — Other Ambulatory Visit

## 2025-02-20 ENCOUNTER — Ambulatory Visit: Admitting: Family Medicine

## 2025-09-04 ENCOUNTER — Encounter: Admitting: Family Medicine
